# Patient Record
Sex: Male | Born: 1955 | Race: White | Hispanic: No | Smoking: Current every day smoker
Health system: Southern US, Community
[De-identification: ages and names within clinical notes are randomized; demographics above are authoritative.]

## PROBLEM LIST (undated history)

## (undated) DIAGNOSIS — Z87442 Personal history of urinary calculi: Secondary | ICD-10-CM

## (undated) DIAGNOSIS — I10 Essential (primary) hypertension: Secondary | ICD-10-CM

## (undated) DIAGNOSIS — F17200 Nicotine dependence, unspecified, uncomplicated: Secondary | ICD-10-CM

## (undated) DIAGNOSIS — I82409 Acute embolism and thrombosis of unspecified deep veins of unspecified lower extremity: Secondary | ICD-10-CM

## (undated) DIAGNOSIS — M1610 Unilateral primary osteoarthritis, unspecified hip: Secondary | ICD-10-CM

## (undated) HISTORY — DX: Essential (primary) hypertension: I10

## (undated) HISTORY — DX: Unilateral primary osteoarthritis, unspecified hip: M16.10

## (undated) HISTORY — DX: Nicotine dependence, unspecified, uncomplicated: F17.200

## (undated) HISTORY — PX: LITHOTRIPSY: SUR834

## (undated) HISTORY — DX: Acute embolism and thrombosis of unspecified deep veins of unspecified lower extremity: I82.409

---

## 2002-12-30 ENCOUNTER — Encounter: Payer: Self-pay | Admitting: Neurosurgery

## 2002-12-30 ENCOUNTER — Ambulatory Visit (HOSPITAL_COMMUNITY): Admission: RE | Admit: 2002-12-30 | Discharge: 2002-12-30 | Payer: Self-pay | Admitting: Neurosurgery

## 2004-03-04 ENCOUNTER — Other Ambulatory Visit (HOSPITAL_COMMUNITY): Admission: RE | Admit: 2004-03-04 | Discharge: 2004-06-02 | Payer: Self-pay | Admitting: Psychiatry

## 2018-05-05 ENCOUNTER — Telehealth: Payer: Self-pay

## 2018-05-05 NOTE — Telephone Encounter (Signed)
Copied from CRM [id number]. Topic: General - Inquiry >> May 05, 2018 11:41 AM Gaynelle Adu wrote: Reason for CRM: pt is calling to request if Dr.todd could personal give him a call back in regards to finding a provider for him. 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 please advise

## 2019-03-18 ENCOUNTER — Other Ambulatory Visit: Payer: Self-pay

## 2019-03-18 DIAGNOSIS — Z20822 Contact with and (suspected) exposure to covid-19: Secondary | ICD-10-CM

## 2019-03-20 LAB — NOVEL CORONAVIRUS, NAA: SARS-CoV-2, NAA: NOT DETECTED

## 2020-03-07 DIAGNOSIS — U071 COVID-19: Secondary | ICD-10-CM

## 2020-03-07 HISTORY — DX: COVID-19: U07.1

## 2020-03-08 ENCOUNTER — Other Ambulatory Visit (HOSPITAL_COMMUNITY): Payer: Self-pay | Admitting: Oncology

## 2020-03-08 NOTE — Progress Notes (Signed)
Called to Discuss with patient about Covid symptoms and the use of regeneron, a monoclonal antibody infusion for those with mild to moderate Covid symptoms and at a high risk of hospitalization.     Pt is qualified for this infusion at the [facility] center due to co-morbid conditions and/or a member of an at-risk group.     Spoke to patient.  Patient qualifies for Mab infusion.  He is on day 8 of his symptoms.  He would like to wait at this time.  He will call us if symptoms worsen.  Mignon Pine, AGNP-C [phone] (Infusion Center Hotline)

## 2020-05-21 ENCOUNTER — Telehealth: Payer: Self-pay | Admitting: *Deleted

## 2020-05-21 NOTE — Telephone Encounter (Signed)
   [facility] Pre-operative Risk Assessment    HEARTCARE STAFF: - Please ensure there is not already an duplicate clearance open for this procedure. - Under Visit Info/Reason for Call, type in Other and utilize the format Clearance MM/DD/YY or Clearance TBD. Do not use dashes or single digits. - If request is for dental extraction, please clarify the # of teeth to be extracted.  Request for surgical clearance:  1. What type of surgery is being performed?  RIGHT TOTAL HIP REPLACEMENT    2. When is this surgery scheduled?  TBD   3. What type of clearance is required (medical clearance vs. Pharmacy clearance to hold med vs. Both)?  MEDICAL  4. Are there any medications that need to be held prior to surgery and how long? N/A   5. Practice name and name of physician performing surgery?  [facility] / DR. CAFFREY   6. What is the office phone number?  [phone]   7.   What is the office fax number?  [phone] ATTN:  KELLY  8.   Anesthesia type (None, local, MAC, general) ?    Jeanann Lewandowsky 05/21/2020, 12:11 PM  _________________________________________________________________   (provider comments below)

## 2020-05-21 NOTE — Telephone Encounter (Signed)
Pt have appt with Dr Ronette Deter 11/19

## 2020-05-21 NOTE — Telephone Encounter (Signed)
   Primary Cardiologist: Scheduled to see Dr. Izora Ribas 06/01/20 to establish care  Chart reviewed as part of pre-operative protocol coverage. Patient is new to our practice and will require an office visit in order to better assess preoperative cardiovascular risk.  Pre-op covering staff: - Please contact requesting surgeon's office via preferred method (i.e, phone, fax) to inform them of need for appointment prior to surgery.  Beatriz Stallion, PA-C  05/21/2020, 2:57 PM

## 2020-06-01 ENCOUNTER — Other Ambulatory Visit: Payer: Self-pay

## 2020-06-01 ENCOUNTER — Encounter: Payer: Self-pay | Admitting: Internal Medicine

## 2020-06-01 ENCOUNTER — Telehealth: Payer: Self-pay

## 2020-06-01 ENCOUNTER — Ambulatory Visit (INDEPENDENT_AMBULATORY_CARE_PROVIDER_SITE_OTHER): Payer: BC Managed Care – PPO | Admitting: Internal Medicine

## 2020-06-01 VITALS — BP 112/60 | HR 75 | Ht 68.0 in | Wt 167.0 lb

## 2020-06-01 DIAGNOSIS — I10 Essential (primary) hypertension: Secondary | ICD-10-CM | POA: Diagnosis not present

## 2020-06-01 DIAGNOSIS — Z720 Tobacco use: Secondary | ICD-10-CM

## 2020-06-01 DIAGNOSIS — Z01810 Encounter for preprocedural cardiovascular examination: Secondary | ICD-10-CM | POA: Diagnosis not present

## 2020-06-01 DIAGNOSIS — E785 Hyperlipidemia, unspecified: Secondary | ICD-10-CM

## 2020-06-01 NOTE — Telephone Encounter (Signed)
ERROR

## 2020-06-01 NOTE — Telephone Encounter (Signed)
Dr. Debby Bud note from 06/01/20 faxed to [facility] for surgery clearance... I also LM on her voicemail.

## 2020-06-01 NOTE — Patient Instructions (Signed)
Medication Instructions:   *If you need a refill on your cardiac medications before your next appointment, please call your pharmacy*   Lab Work:  If you have labs (blood work) drawn today and your tests are completely normal, you will receive your results only by: Marland Kitchen MyChart Message (if you have MyChart) OR . A paper copy in the mail If you have any lab test that is abnormal or we need to change your treatment, we will call you to review the results.   Testing/Procedures:    Follow-Up: At [facility], you and your health needs are our priority.  As part of our continuing mission to provide you with exceptional heart care, we have created designated Provider Care Teams.  These Care Teams include your primary Cardiologist (physician) and Advanced Practice Providers (APPs -  Physician Assistants and Nurse Practitioners) who all work together to provide you with the care you need, when you need it.  We recommend signing up for the patient portal called "MyChart".  Sign up information is provided on this After Visit Summary.  MyChart is used to connect with patients for Virtual Visits (Telemedicine).  Patients are able to view lab/test results, encounter notes, upcoming appointments, etc.  Non-urgent messages can be sent to your provider as well.   To learn more about what you can do with MyChart, go to [email or web address].    Your next appointment:   4 month(s)  The format for your next appointment:   In Person  Provider:   Riley Lam, MD   Other Instructions

## 2020-06-01 NOTE — Progress Notes (Signed)
Cardiology Office Note:    Date:  06/01/2020   ID:  KYVON HU, DOB [birth year], MRN [id number]  PCP:  Eartha Inch, MD  [facility] Cardiologist:  No primary care provider on file.  [facility] Electrophysiologist:  None   CC: Preoperative R hip surgery Consulted for the evaluation of preoperative risk stratification at the behest of Eartha Inch, MD  History of Present Illness:    Anthony Small is a 64 y.o. male with a hx of HTN, HLD Tobacco abuse (dow to 1/2 pack a day), prior DVT/PE; evidence of prior CT imaging of prior microvascular ischemia without deficit, for surgical risk stratification for R hip surgery:  Dr. Delbert Harness, General Anesthesia.  Patient notes that he has had right hip that has progressed to painful walking.  As part of eval, is hoping to get R hip surgery.  Patient notes that he is much more sedentary that he used to be.  Able to do house chores. Never had chest pain, chest pressure, SOB, PND, Orthopnea, Syncope or near syncope.  His limitations for golf or heavy house work (and other significant activity) has been R hip pain. Does not easy bleeding.  Ambulatory Blood pressure note 120/80.  Past Medical History:  Diagnosis Date  . Arthritis of hip   . DVT (deep venous thrombosis) (HCC)   . Hypertension   . Smoker     History reviewed. No pertinent surgical history.  Current Medications: Current Meds  Medication Sig  . amLODipine (NORVASC) 5 MG tablet Take 5 mg by mouth daily.  Marland Kitchen Apixaban (ELIQUIS PO) Take by mouth.  Marland Kitchen HYDROcodone-acetaminophen (NORCO/VICODIN) 5-325 MG tablet Take 1 tablet by mouth every 4 (four) hours as needed.  Marland Kitchen lisinopril (ZESTRIL) 10 MG tablet Take 10 mg by mouth daily.  . rosuvastatin (CRESTOR) 10 MG tablet Take 10 mg by mouth at bedtime.     Allergies:   Bee venom   Social History   Socioeconomic History  . Marital status: Married    Spouse name: Not on file  . Number of children: Not on file    . Years of education: Not on file  . Highest education level: Not on file  Occupational History  . Not on file  Tobacco Use  . Smoking status: Current Every Day Smoker  . Smokeless tobacco: Never Used  Substance and Sexual Activity  . Alcohol use: Not on file  . Drug use: Not on file  . Sexual activity: Not on file  Other Topics Concern  . Not on file  Social History Narrative  . Not on file   Social Determinants of Health   Financial Resource Strain:   . Difficulty of Paying Living Expenses: Not on file  Food Insecurity:   . Worried About Programme researcher, broadcasting/film/video in the Last Year: Not on file  . Ran Out of Food in the Last Year: Not on file  Transportation Needs:   . Lack of Transportation (Medical): Not on file  . Lack of Transportation (Non-Medical): Not on file  Physical Activity:   . Days of Exercise per Week: Not on file  . Minutes of Exercise per Session: Not on file  Stress:   . Feeling of Stress : Not on file  Social Connections:   . Frequency of Communication with Friends and Family: Not on file  . Frequency of Social Gatherings with Friends and Family: Not on file  . Attends Religious Services: Not on file  .  Active Member of Clubs or Organizations: Not on file  . Attends Banker Meetings: Not on file  . Marital Status: Not on file    Former Respiratory Therapist  Family History: The patient's family history includes Cancer in his father; Hypertension in his mother. Sister had AV Malformation  ROS:   Please see the history of present illness.    All other systems reviewed and are negative.  EKGs/Labs/Other Studies Reviewed:    The following studies were reviewed today:  EKG:  EKG is ordered today.  The ekg ordered today demonstrates SR rate 75, nonspecific TWI  Recent Labs: No results found for requested labs within last 8760 hours.  Recent Lipid Panel No results found for: CHOL, TRIG, HDL, CHOLHDL, VLDL, LDLCALC, LDLDIRECT  Physical  Exam:    VS:  BP 112/60   Pulse 75   Ht 5\' 8"  (1.727 m)   Wt 167 lb (75.8 kg)   SpO2 98%   BMI 25.39 kg/m     Wt Readings from Last 3 Encounters:  06/01/20 167 lb (75.8 kg)    GEN: Well nourished, well developed in no acute distress HEENT: Normal NECK: No JVD; No carotid bruits LYMPHATICS: No lymphadenopathy CARDIAC: RRR, no murmurs, rubs, gallops RESPIRATORY:  Clear to auscultation without rales, wheezing or rhonchi  ABDOMEN: Soft, non-tender, non-distended MUSCULOSKELETAL:  No edema; No deformity  SKIN: Warm and dry NEUROLOGIC:  Alert and oriented x 3 PSYCHIATRIC:  Normal affect   ASSESSMENT:    1. Preoperative cardiovascular examination   2. Essential hypertension   3. Hyperlipidemia, unspecified hyperlipidemia type   4. Tobacco abuse    PLAN:    In order of problems listed above:  Preoperative Risk Assessment - The Revised Cardiac Risk Index = 1 (with borderline CVA) =0.9% estimated risk of perioperative myocardial infarction, pulmonary edema, ventricular fibrillation, cardiac arrest, or complete heart block.  - DASI of 29, consistent with 6 Functional Mets - No further cardiac testing is recommended prior to surgery.  - The patient may proceed to surgery at acceptable risk.   - Our service is available as needed in the peri-operative period.    Essential Hypertension HLD - ambulatory blood pressure 120/80, will continue ambulatory BP monitoring (gave education) - LDL well controlled - would hold the lipid   DVT - continue  Tobacco Abuse - discussed the dangers of tobacco use, both inhaled and oral, which include, but are not limited to cardiovascular disease, increased cancer risk of multiple types of cancer, COPD, peripheral arterial disease, strokes. - counseled on the benefits of smoking cessation. - firmly advised to quit.  - we also reviewed strategies to maximize success, including:  Removing cigarettes and smoking materials from environment    Stress management  Substitution of other forms of reinforcement (the one cigarette a day approach)  Support of family/friends and group smoking cessation  Selecting a quit date.  Patient provided contact information for [facility] or 1-800-QUIT-NOW  Patient provided with [employer] free smoking cessation classes: [phone] and [phone]  - discussed smoking cessation in the perioperative period - at next visit will trial Chantix again  4-5 months follow up unless new symptoms or abnormal test results warranting change in plan  Would be reasonable for Virtual Follow up Would be reasonable for  APP Follow up   Shared Decision Making/Informed Consent       Medication Adjustments/Labs and Tests Ordered: Current medicines are reviewed at length with the patient today.  Concerns  regarding medicines are outlined above.  Orders Placed This Encounter  Procedures  . EKG 12-Lead   No orders of the defined types were placed in this encounter.   Patient Instructions  Medication Instructions:   *If you need a refill on your cardiac medications before your next appointment, please call your pharmacy*   Lab Work:  If you have labs (blood work) drawn today and your tests are completely normal, you will receive your results only by: Marland Kitchen MyChart Message (if you have MyChart) OR . A paper copy in the mail If you have any lab test that is abnormal or we need to change your treatment, we will call you to review the results.   Testing/Procedures:    Follow-Up: At [facility], you and your health needs are our priority.  As part of our continuing mission to provide you with exceptional heart care, we have created designated Provider Care Teams.  These Care Teams include your primary Cardiologist (physician) and Advanced Practice Providers (APPs -  Physician Assistants and Nurse Practitioners) who all work together to provide you with the care you need,  when you need it.  We recommend signing up for the patient portal called "MyChart".  Sign up information is provided on this After Visit Summary.  MyChart is used to connect with patients for Virtual Visits (Telemedicine).  Patients are able to view lab/test results, encounter notes, upcoming appointments, etc.  Non-urgent messages can be sent to your provider as well.   To learn more about what you can do with MyChart, go to [email or web address].    Your next appointment:   4 month(s)  The format for your next appointment:   In Person  Provider:   Riley Lam, MD   Other Instructions      Signed, Christell Constant, MD  06/01/2020 9:20 AM    [facility]

## 2020-06-18 ENCOUNTER — Ambulatory Visit: Payer: Self-pay | Admitting: Physician Assistant

## 2020-06-18 NOTE — H&P (Signed)
TOTAL HIP ADMISSION H&P  Patient is admitted for right total hip arthroplasty.  Subjective:  Chief Complaint: right hip pain  HPI: Anthony Small, 64 y.o. male, has a history of pain and functional disability in the right hip(s) due to arthritis and patient has failed non-surgical conservative treatments for greater than 12 weeks to include NSAID's and/or analgesics and activity modification.  Onset of symptoms was gradual starting 7 years ago with gradually worsening course since that time.The patient noted no past surgery on the right hip(s).  Patient currently rates pain in the right hip at 9 out of 10 with activity. Patient has night pain, worsening of pain with activity and weight bearing, pain that interfers with activities of daily living and pain with passive range of motion. Patient has evidence of subchondral cysts, periarticular osteophytes and joint space narrowing by imaging studies. This condition presents safety issues increasing the risk of falls. There is no current active infection.  Patient Active Problem List   Diagnosis Date Noted  . Preoperative cardiovascular examination 06/01/2020  . Essential hypertension 06/01/2020  . Hyperlipidemia 06/01/2020  . Tobacco abuse 06/01/2020   Past Medical History:  Diagnosis Date  . Arthritis of hip   . DVT (deep venous thrombosis) (HCC)   . Hypertension   . Smoker     No past surgical history on file.  Current Outpatient Medications  Medication Sig Dispense Refill Last Dose  . amLODipine (NORVASC) 5 MG tablet Take 5 mg by mouth daily.     . apixaban (ELIQUIS) 5 MG TABS tablet Take 5 mg by mouth 2 (two) times daily.      . HYDROcodone-acetaminophen (NORCO/VICODIN) 5-325 MG tablet Take 1 tablet by mouth every 6 (six) hours as needed for moderate pain.      . lisinopril (ZESTRIL) 10 MG tablet Take 10 mg by mouth daily.     . rosuvastatin (CRESTOR) 10 MG tablet Take 10 mg by mouth at bedtime.      No current  facility-administered medications for this visit.   Allergies  Allergen Reactions  . Bee Venom Hives    Social History   Tobacco Use  . Smoking status: Current Every Day Smoker  . Smokeless tobacco: Never Used  Substance Use Topics  . Alcohol use: Not on file    Family History  Problem Relation Age of Onset  . Hypertension Mother   . Cancer Father      Review of Systems  HENT: Positive for tinnitus.   Cardiovascular: Positive for chest pain.  Gastrointestinal: Positive for constipation, diarrhea, nausea and vomiting.  Musculoskeletal: Positive for arthralgias.  Neurological: Positive for headaches.  Hematological: Bruises/bleeds easily.  All other systems reviewed and are negative.   Objective:  Physical Exam Constitutional:      General: He is not in acute distress.    Appearance: Normal appearance.  HENT:     Head: Normocephalic and atraumatic.  Eyes:     Extraocular Movements: Extraocular movements intact.     Pupils: Pupils are equal, round, and reactive to light.  Cardiovascular:     Rate and Rhythm: Regular rhythm. Tachycardia present.     Pulses: Normal pulses.     Heart sounds: Normal heart sounds.  Pulmonary:     Effort: Pulmonary effort is normal. No respiratory distress.     Breath sounds: Normal breath sounds.  Abdominal:     General: Abdomen is flat. Bowel sounds are normal. There is no distension.     Palpations: Abdomen   is soft.     Tenderness: There is no abdominal tenderness.  Musculoskeletal:     Cervical back: Normal range of motion and neck supple.     Right hip: Tenderness and bony tenderness present. Decreased range of motion. Decreased strength.  Lymphadenopathy:     Cervical: No cervical adenopathy.  Skin:    General: Skin is warm and dry.     Findings: No erythema or rash.  Neurological:     General: No focal deficit present.     Mental Status: He is alert and oriented to person, place, and time.  Psychiatric:        Mood and  Affect: Mood normal.        Behavior: Behavior normal.     Vital signs in last 24 hours: @VSRANGES @  Labs:   Estimated body mass index is 25.39 kg/m as calculated from the following:   Height as of 06/01/20: 5\' 8"  (1.727 m).   Weight as of 06/01/20: 75.8 kg.   Imaging Review Plain radiographs demonstrate severe degenerative joint disease of the right hip(s). The bone quality appears to be good for age and reported activity level.      Assessment/Plan:  End stage arthritis, right hip(s)  The patient history, physical examination, clinical judgement of the provider and imaging studies are consistent with end stage degenerative joint disease of the right hip(s) and total hip arthroplasty is deemed medically necessary. The treatment options including medical management, injection therapy, arthroscopy and arthroplasty were discussed at length. The risks and benefits of total hip arthroplasty were presented and reviewed. The risks due to aseptic loosening, infection, stiffness, dislocation/subluxation,  thromboembolic complications and other imponderables were discussed.  The patient acknowledged the explanation, agreed to proceed with the plan and consent was signed. Patient is being admitted for inpatient treatment for surgery, pain control, PT, OT, prophylactic antibiotics, VTE prophylaxis, progressive ambulation and ADL's and discharge planning.The patient is planning to be discharged home with outpt PT   Anticipated LOS equal to or greater than 2 midnights due to - Age 65 and older with one or more of the following:  - Obesity  - Expected need for hospital services (PT, OT, Nursing) required for safe  discharge  - Anticipated need for postoperative skilled nursing care or inpatient rehab  - Active co-morbidities: Stroke and DVT/VTE OR   - Unanticipated findings during/Post Surgery: None  - Patient is a high risk of re-admission due to: None

## 2020-06-18 NOTE — H&P (View-Only) (Signed)
TOTAL HIP ADMISSION H&P  Patient is admitted for right total hip arthroplasty.  Subjective:  Chief Complaint: right hip pain  HPI: Anthony Small, 64 y.o. male, has a history of pain and functional disability in the right hip(s) due to arthritis and patient has failed non-surgical conservative treatments for greater than 12 weeks to include NSAID's and/or analgesics and activity modification.  Onset of symptoms was gradual starting 7 years ago with gradually worsening course since that time.The patient noted no past surgery on the right hip(s).  Patient currently rates pain in the right hip at 9 out of 10 with activity. Patient has night pain, worsening of pain with activity and weight bearing, pain that interfers with activities of daily living and pain with passive range of motion. Patient has evidence of subchondral cysts, periarticular osteophytes and joint space narrowing by imaging studies. This condition presents safety issues increasing the risk of falls. There is no current active infection.  Patient Active Problem List   Diagnosis Date Noted  . Preoperative cardiovascular examination 06/01/2020  . Essential hypertension 06/01/2020  . Hyperlipidemia 06/01/2020  . Tobacco abuse 06/01/2020   Past Medical History:  Diagnosis Date  . Arthritis of hip   . DVT (deep venous thrombosis) (HCC)   . Hypertension   . Smoker     No past surgical history on file.  Current Outpatient Medications  Medication Sig Dispense Refill Last Dose  . amLODipine (NORVASC) 5 MG tablet Take 5 mg by mouth daily.     Marland Kitchen apixaban (ELIQUIS) 5 MG TABS tablet Take 5 mg by mouth 2 (two) times daily.      Marland Kitchen HYDROcodone-acetaminophen (NORCO/VICODIN) 5-325 MG tablet Take 1 tablet by mouth every 6 (six) hours as needed for moderate pain.      Marland Kitchen lisinopril (ZESTRIL) 10 MG tablet Take 10 mg by mouth daily.     . rosuvastatin (CRESTOR) 10 MG tablet Take 10 mg by mouth at bedtime.      No current  facility-administered medications for this visit.   Allergies  Allergen Reactions  . Bee Venom Hives    Social History   Tobacco Use  . Smoking status: Current Every Day Smoker  . Smokeless tobacco: Never Used  Substance Use Topics  . Alcohol use: Not on file    Family History  Problem Relation Age of Onset  . Hypertension Mother   . Cancer Father      Review of Systems  HENT: Positive for tinnitus.   Cardiovascular: Positive for chest pain.  Gastrointestinal: Positive for constipation, diarrhea, nausea and vomiting.  Musculoskeletal: Positive for arthralgias.  Neurological: Positive for headaches.  Hematological: Bruises/bleeds easily.  All other systems reviewed and are negative.   Objective:  Physical Exam Constitutional:      General: He is not in acute distress.    Appearance: Normal appearance.  HENT:     Head: Normocephalic and atraumatic.  Eyes:     Extraocular Movements: Extraocular movements intact.     Pupils: Pupils are equal, round, and reactive to light.  Cardiovascular:     Rate and Rhythm: Regular rhythm. Tachycardia present.     Pulses: Normal pulses.     Heart sounds: Normal heart sounds.  Pulmonary:     Effort: Pulmonary effort is normal. No respiratory distress.     Breath sounds: Normal breath sounds.  Abdominal:     General: Abdomen is flat. Bowel sounds are normal. There is no distension.     Palpations: Abdomen  is soft.     Tenderness: There is no abdominal tenderness.  Musculoskeletal:     Cervical back: Normal range of motion and neck supple.     Right hip: Tenderness and bony tenderness present. Decreased range of motion. Decreased strength.  Lymphadenopathy:     Cervical: No cervical adenopathy.  Skin:    General: Skin is warm and dry.     Findings: No erythema or rash.  Neurological:     General: No focal deficit present.     Mental Status: He is alert and oriented to person, place, and time.  Psychiatric:        Mood and  Affect: Mood normal.        Behavior: Behavior normal.     Vital signs in last 24 hours: @VSRANGES @  Labs:   Estimated body mass index is 25.39 kg/m as calculated from the following:   Height as of 06/01/20: 5\' 8"  (1.727 m).   Weight as of 06/01/20: 75.8 kg.   Imaging Review Plain radiographs demonstrate severe degenerative joint disease of the right hip(s). The bone quality appears to be good for age and reported activity level.      Assessment/Plan:  End stage arthritis, right hip(s)  The patient history, physical examination, clinical judgement of the provider and imaging studies are consistent with end stage degenerative joint disease of the right hip(s) and total hip arthroplasty is deemed medically necessary. The treatment options including medical management, injection therapy, arthroscopy and arthroplasty were discussed at length. The risks and benefits of total hip arthroplasty were presented and reviewed. The risks due to aseptic loosening, infection, stiffness, dislocation/subluxation,  thromboembolic complications and other imponderables were discussed.  The patient acknowledged the explanation, agreed to proceed with the plan and consent was signed. Patient is being admitted for inpatient treatment for surgery, pain control, PT, OT, prophylactic antibiotics, VTE prophylaxis, progressive ambulation and ADL's and discharge planning.The patient is planning to be discharged home with outpt PT   Anticipated LOS equal to or greater than 2 midnights due to - Age 65 and older with one or more of the following:  - Obesity  - Expected need for hospital services (PT, OT, Nursing) required for safe  discharge  - Anticipated need for postoperative skilled nursing care or inpatient rehab  - Active co-morbidities: Stroke and DVT/VTE OR   - Unanticipated findings during/Post Surgery: None  - Patient is a high risk of re-admission due to: None

## 2020-06-19 ENCOUNTER — Encounter (HOSPITAL_COMMUNITY): Payer: Self-pay

## 2020-06-19 NOTE — Progress Notes (Addendum)
COVID Vaccine Completed:   x3 Date COVID Vaccine completed:  Booster 11-21 COVID vaccine manufacturer: [employer]   PCP - Antony Haste, MD Cardiologist - Dr. Izora Ribas  Cardiac clearance in Epic dated 06-01-20 by Dr. Izora Ribas.  Medical clearance on chart dated 05-21-20 from Dr. Cyndia Bent  Chest x-ray -  EKG - 06-01-20 in Epic Stress Test -  ECHO -  Cardiac Cath -  Pacemaker/ICD device last checked:  Sleep Study -  CPAP -   Fasting Blood Sugar -  Checks Blood Sugar _____ times a day  Blood Thinner Instructions:  Eliquis 5 mg for DVT.  To stop 2 days prior to surgery per medical clearance dated 05-21-20. Aspirin Instructions: Last Dose:  Anesthesia review:  Pt follows with cardiology for hx of DVT.    Patient denies shortness of breath, fever, cough and chest pain at PAT appointment.  Patient able to slowly walk a flight of stairs due to hip pain.  Able to perform ADLs independently.   Patient verbalized understanding of instructions that were given to them at the PAT appointment. Patient was also instructed that they will need to review over the PAT instructions again at home before surgery.

## 2020-06-19 NOTE — Patient Instructions (Addendum)
DUE TO COVID-19 ONLY ONE VISITOR IS ALLOWED TO COME WITH YOU AND STAY IN THE WAITING ROOM ONLY DURING PRE OP AND PROCEDURE.   IF YOU WILL BE ADMITTED INTO THE HOSPITAL YOU ARE ALLOWED ONE SUPPORT PERSON DURING VISITATION HOURS ONLY (10AM -8PM)   . The support person may change daily. . The support person must pass our screening, gel in and out, and wear a mask at all times, including in the patient's room. . Patients must also wear a mask when staff or their support person are in the room.   COVID SWAB TESTING MUST BE COMPLETED ON:  Saturday, 06-23-20 @ 9:20 AM   [address] [place], [place] [address]  (Must self quarantine after testing. Follow instructions on handout.)    Your procedure is scheduled on:  Wednesday, 06-27-20   Report to [facility] Main  Entrance   Report to admitting at 9:15 AM   Call this number if you have problems the morning of surgery [phone]   Do not eat food :After Midnight.   May have liquids until 8:45 AM day of surgery  CLEAR LIQUID DIET  Foods Allowed                                                                     Foods Excluded  Water, Black Coffee and tea, regular and decaf           liquids that you cannot  Plain Jell-O in any flavor  (No red)                                  see through such as: Fruit ices (not with fruit pulp)                                      milk, soups, orange juice              Iced Popsicles (No red)                                      All solid food                                   Apple juices Sports drinks like Gatorade (No red) Lightly seasoned clear broth or consume(fat free) Sugar, honey syrup     Complete one Ensure drink the morning of surgery at 8:45 AM  the day of surgery.     Oral Hygiene is also important to reduce your risk of infection.                                    Remember - BRUSH YOUR TEETH THE MORNING OF SURGERY WITH YOUR REGULAR TOOTHPASTE   Do NOT smoke after  Midnight   Take these medicines the morning of surgery with A SIP OF WATER:  Amlodipine, Crestor                                You may not have any metal on your body including  jewelry, and body piercings             Do not wear lotions, powders, perfumes/cologne, or deodorant             Men may shave face and neck.   Do not bring valuables to the hospital. [facility] IS NOT RESPONSIBLE   FOR VALUABLES.   Contacts, dentures or bridgework may not be worn into surgery.   Bring small overnight bag day of surgery.     Special Instructions: Bring a copy of your healthcare power of attorney and living will documents the day of surgery if you haven't scanned them in before.              Please read over the following fact sheets you were given: IF YOU HAVE QUESTIONS ABOUT YOUR PRE OP INSTRUCTIONS PLEASE CALL [phone]   [facility] - Preparing for Surgery Before surgery, you can play an important role.  Because skin is not sterile, your skin needs to be as free of germs as possible.  You can reduce the number of germs on your skin by washing with CHG (chlorahexidine gluconate) soap before surgery.  CHG is an antiseptic cleaner which kills germs and bonds with the skin to continue killing germs even after washing. Please DO NOT use if you have an allergy to CHG or antibacterial soaps.  If your skin becomes reddened/irritated stop using the CHG and inform your nurse when you arrive at Short Stay. Do not shave (including legs and underarms) for at least 48 hours prior to the first CHG shower.  You may shave your face/neck.  Please follow these instructions carefully:  1.  Shower with CHG Soap the night before surgery and the  morning of surgery.  2.  If you choose to wash your hair, wash your hair first as usual with your normal  shampoo.  3.  After you shampoo, rinse your hair and body thoroughly to remove the shampoo.                             4.  Use CHG as you would any other liquid  soap.  You can apply chg directly to the skin and wash.  Gently with a scrungie or clean washcloth.  5.  Apply the CHG Soap to your body ONLY FROM THE NECK DOWN.   Do   not use on face/ open                           Wound or open sores. Avoid contact with eyes, ears mouth and   genitals (private parts).                       Wash face,  Genitals (private parts) with your normal soap.             6.  Wash thoroughly, paying special attention to the area where your    surgery  will be performed.  7.  Thoroughly rinse your body with warm water from the neck down.  8.  DO NOT shower/wash with your normal soap after using and rinsing  off the CHG Soap.                9.  Pat yourself dry with a clean towel.            10.  Wear clean pajamas.            11.  Place clean sheets on your bed the night of your first shower and do not  sleep with pets. Day of Surgery : Do not apply any lotions/deodorants the morning of surgery.  Please wear clean clothes to the hospital/surgery center.  FAILURE TO FOLLOW THESE INSTRUCTIONS MAY RESULT IN THE CANCELLATION OF YOUR SURGERY  PATIENT SIGNATURE_________________________________  NURSE SIGNATURE__________________________________  ________________________________________________________________________   Rogelia MireIncentive Spirometer  An incentive spirometer is a tool that can help keep your lungs clear and active. This tool measures how well you are filling your lungs with each breath. Taking long deep breaths may help reverse or decrease the chance of developing breathing (pulmonary) problems (especially infection) following:  A long period of time when you are unable to move or be active. BEFORE THE PROCEDURE   If the spirometer includes an indicator to show your best effort, your nurse or respiratory therapist will set it to a desired goal.  If possible, sit up straight or lean slightly forward. Try not to slouch.  Hold the incentive spirometer in an upright  position. INSTRUCTIONS FOR USE  1. Sit on the edge of your bed if possible, or sit up as far as you can in bed or on a chair. 2. Hold the incentive spirometer in an upright position. 3. Breathe out normally. 4. Place the mouthpiece in your mouth and seal your lips tightly around it. 5. Breathe in slowly and as deeply as possible, raising the piston or the ball toward the top of the column. 6. Hold your breath for 3-5 seconds or for as long as possible. Allow the piston or ball to fall to the bottom of the column. 7. Remove the mouthpiece from your mouth and breathe out normally. 8. Rest for a few seconds and repeat Steps 1 through 7 at least 10 times every 1-2 hours when you are awake. Take your time and take a few normal breaths between deep breaths. 9. The spirometer may include an indicator to show your best effort. Use the indicator as a goal to work toward during each repetition. 10. After each set of 10 deep breaths, practice coughing to be sure your lungs are clear. If you have an incision (the cut made at the time of surgery), support your incision when coughing by placing a pillow or rolled up towels firmly against it. Once you are able to get out of bed, walk around indoors and cough well. You may stop using the incentive spirometer when instructed by your caregiver.  RISKS AND COMPLICATIONS  Take your time so you do not get dizzy or light-headed.  If you are in pain, you may need to take or ask for pain medication before doing incentive spirometry. It is harder to take a deep breath if you are having pain. AFTER USE  Rest and breathe slowly and easily.  It can be helpful to keep track of a log of your progress. Your caregiver can provide you with a simple table to help with this. If you are using the spirometer at home, follow these instructions: SEEK MEDICAL CARE IF:   You are having difficultly using the spirometer.  You have trouble using the spirometer  as often as  instructed.  Your pain medication is not giving enough relief while using the spirometer.  You develop fever of 100.5 F (38.1 C) or higher. SEEK IMMEDIATE MEDICAL CARE IF:   You cough up bloody sputum that had not been present before.  You develop fever of 102 F (38.9 C) or greater.  You develop worsening pain at or near the incision site. MAKE SURE YOU:   Understand these instructions.  Will watch your condition.  Will get help right away if you are not doing well or get worse. Document Released: 11/10/2006 Document Revised: 09/22/2011 Document Reviewed: 01/11/2007 Exit[employer] Patient Information 2014 [employer], [place].   ________________________________________________________________________  WHAT IS A BLOOD TRANSFUSION? Blood Transfusion Information  A transfusion is the replacement of blood or some of its parts. Blood is made up of multiple cells which provide different functions.  Red blood cells carry oxygen and are used for blood loss replacement.  White blood cells fight against infection.  Platelets control bleeding.  Plasma helps clot blood.  Other blood products are available for specialized needs, such as hemophilia or other clotting disorders. BEFORE THE TRANSFUSION  Who gives blood for transfusions?   Healthy volunteers who are fully evaluated to make sure their blood is safe. This is blood bank blood. Transfusion therapy is the safest it has ever been in the practice of medicine. Before blood is taken from a donor, a complete history is taken to make sure that person has no history of diseases nor engages in risky social behavior (examples are intravenous drug use or sexual activity with multiple partners). The donor's travel history is screened to minimize risk of transmitting infections, such as malaria. The donated blood is tested for signs of infectious diseases, such as HIV and hepatitis. The blood is then tested to be sure it is compatible with you in  order to minimize the chance of a transfusion reaction. If you or a relative donates blood, this is often done in anticipation of surgery and is not appropriate for emergency situations. It takes many days to process the donated blood. RISKS AND COMPLICATIONS Although transfusion therapy is very safe and saves many lives, the main dangers of transfusion include:   Getting an infectious disease.  Developing a transfusion reaction. This is an allergic reaction to something in the blood you were given. Every precaution is taken to prevent this. The decision to have a blood transfusion has been considered carefully by your caregiver before blood is given. Blood is not given unless the benefits outweigh the risks. AFTER THE TRANSFUSION  Right after receiving a blood transfusion, you will usually feel much better and more energetic. This is especially true if your red blood cells have gotten low (anemic). The transfusion raises the level of the red blood cells which carry oxygen, and this usually causes an energy increase.  The nurse administering the transfusion will monitor you carefully for complications. HOME CARE INSTRUCTIONS  No special instructions are needed after a transfusion. You may find your energy is better. Speak with your caregiver about any limitations on activity for underlying diseases you may have. SEEK MEDICAL CARE IF:   Your condition is not improving after your transfusion.  You develop redness or irritation at the intravenous (IV) site. SEEK IMMEDIATE MEDICAL CARE IF:  Any of the following symptoms occur over the next 12 hours:  Shaking chills.  You have a temperature by mouth above 102 F (38.9 C), not controlled by medicine.  Chest, back,  or muscle pain.  People around you feel you are not acting correctly or are confused.  Shortness of breath or difficulty breathing.  Dizziness and fainting.  You get a rash or develop hives.  You have a decrease in urine  output.  Your urine turns a dark color or changes to pink, red, or brown. Any of the following symptoms occur over the next 10 days:  You have a temperature by mouth above 102 F (38.9 C), not controlled by medicine.  Shortness of breath.  Weakness after normal activity.  The white part of the eye turns yellow (jaundice).  You have a decrease in the amount of urine or are urinating less often.  Your urine turns a dark color or changes to pink, red, or brown. Document Released: 06/27/2000 Document Revised: 09/22/2011 Document Reviewed: 02/14/2008 [facility] Patient Information 2014 [place], [place].  _______________________________________________________________________

## 2020-06-20 ENCOUNTER — Encounter (HOSPITAL_COMMUNITY)
Admission: RE | Admit: 2020-06-20 | Discharge: 2020-06-20 | Disposition: A | Discharge status: Home / Self Care | Payer: BC Managed Care – PPO | Source: Ambulatory Visit | Attending: Orthopedic Surgery | Admitting: Orthopedic Surgery

## 2020-06-20 ENCOUNTER — Encounter (HOSPITAL_COMMUNITY): Payer: Self-pay

## 2020-06-20 ENCOUNTER — Other Ambulatory Visit: Payer: Self-pay

## 2020-06-20 DIAGNOSIS — Z01812 Encounter for preprocedural laboratory examination: Secondary | ICD-10-CM | POA: Insufficient documentation

## 2020-06-20 HISTORY — DX: Personal history of urinary calculi: Z87.442

## 2020-06-20 LAB — CBC WITH DIFFERENTIAL/PLATELET
Abs Immature Granulocytes: 0.03 10*3/uL (ref 0.00–0.07)
Basophils Absolute: 0.1 10*3/uL (ref 0.0–0.1)
Basophils Relative: 0 %
Eosinophils Absolute: 0.1 10*3/uL (ref 0.0–0.5)
Eosinophils Relative: 0 %
HCT: 47.4 % (ref 39.0–52.0)
Hemoglobin: 16.1 g/dL (ref 13.0–17.0)
Immature Granulocytes: 0 %
Lymphocytes Relative: 23 %
Lymphs Abs: 2.6 10*3/uL (ref 0.7–4.0)
MCH: 33.3 pg (ref 26.0–34.0)
MCHC: 34 g/dL (ref 30.0–36.0)
MCV: 97.9 fL (ref 80.0–100.0)
Monocytes Absolute: 0.7 10*3/uL (ref 0.1–1.0)
Monocytes Relative: 6 %
Neutro Abs: 7.8 10*3/uL — ABNORMAL HIGH (ref 1.7–7.7)
Neutrophils Relative %: 71 %
Platelets: 266 10*3/uL (ref 150–400)
RBC: 4.84 MIL/uL (ref 4.22–5.81)
RDW: 13.9 % (ref 11.5–15.5)
WBC: 11.3 10*3/uL — ABNORMAL HIGH (ref 4.0–10.5)
nRBC: 0 % (ref 0.0–0.2)

## 2020-06-20 LAB — COMPREHENSIVE METABOLIC PANEL
ALT: 18 U/L (ref 0–44)
AST: 21 U/L (ref 15–41)
Albumin: 4.2 g/dL (ref 3.5–5.0)
Alkaline Phosphatase: 85 U/L (ref 38–126)
Anion gap: 13 (ref 5–15)
BUN: 17 mg/dL (ref 8–23)
CO2: 22 mmol/L (ref 22–32)
Calcium: 9.3 mg/dL (ref 8.9–10.3)
Chloride: 106 mmol/L (ref 98–111)
Creatinine, Ser: 0.99 mg/dL (ref 0.61–1.24)
GFR, Estimated: >60 mL/min (ref >60)
Glucose, Bld: 98 mg/dL (ref 70–99)
Potassium: 4.5 mmol/L (ref 3.5–5.1)
Sodium: 141 mmol/L (ref 135–145)
Total Bilirubin: 1 mg/dL (ref 0.3–1.2)
Total Protein: 7.8 g/dL (ref 6.5–8.1)

## 2020-06-20 LAB — SURGICAL PCR SCREEN
MRSA, PCR: NEGATIVE
Staphylococcus aureus: POSITIVE — AB

## 2020-06-20 LAB — PROTIME-INR
INR: 1 (ref 0.8–1.2)
Prothrombin Time: 13.2 seconds (ref 11.4–15.2)

## 2020-06-20 LAB — APTT: aPTT: 29 seconds (ref 24–36)

## 2020-06-20 NOTE — Progress Notes (Signed)
PCR results sent to Dr. Madelon Lips to review.

## 2020-06-23 ENCOUNTER — Other Ambulatory Visit (HOSPITAL_COMMUNITY)
Admission: RE | Admit: 2020-06-23 | Discharge: 2020-06-23 | Disposition: A | Discharge status: Home / Self Care | Payer: BC Managed Care – PPO | Source: Ambulatory Visit | Attending: Orthopedic Surgery | Admitting: Orthopedic Surgery

## 2020-06-23 DIAGNOSIS — Z01812 Encounter for preprocedural laboratory examination: Secondary | ICD-10-CM | POA: Insufficient documentation

## 2020-06-23 DIAGNOSIS — Z20822 Contact with and (suspected) exposure to covid-19: Secondary | ICD-10-CM | POA: Diagnosis not present

## 2020-06-24 LAB — SARS CORONAVIRUS 2 (TAT 6-24 HRS): SARS Coronavirus 2: NEGATIVE

## 2020-06-26 MED ORDER — BUPIVACAINE LIPOSOME 1.3 % IJ SUSP
10.0000 mL | Freq: Once | INTRAMUSCULAR | Status: DC
  Filled 2020-06-26: qty 10

## 2020-06-26 MED ORDER — TRANEXAMIC ACID 1000 MG/10ML IV SOLN
2000.0000 mg | INTRAVENOUS | Status: DC
  Filled 2020-06-26: qty 20

## 2020-06-26 NOTE — Progress Notes (Signed)
Called patient about time change for surgery on 06/27/20. To arrive 0800 for surgery. Complete ensure drink by 0730.

## 2020-06-27 ENCOUNTER — Ambulatory Visit (HOSPITAL_COMMUNITY)
Admission: RE | Admit: 2020-06-27 | Discharge: 2020-06-28 | Disposition: A | Discharge status: Home / Self Care | Payer: BC Managed Care – PPO | Source: Ambulatory Visit | Attending: Orthopedic Surgery | Admitting: Orthopedic Surgery

## 2020-06-27 ENCOUNTER — Ambulatory Visit: Payer: Self-pay | Admitting: Physician Assistant

## 2020-06-27 ENCOUNTER — Encounter (HOSPITAL_COMMUNITY)
Admission: RE | Disposition: A | Discharge status: Home / Self Care | Payer: Self-pay | Source: Ambulatory Visit | Attending: Orthopedic Surgery

## 2020-06-27 ENCOUNTER — Ambulatory Visit (HOSPITAL_COMMUNITY): Payer: BC Managed Care – PPO | Admitting: Certified Registered Nurse Anesthetist

## 2020-06-27 ENCOUNTER — Other Ambulatory Visit: Payer: Self-pay

## 2020-06-27 ENCOUNTER — Encounter (HOSPITAL_COMMUNITY): Payer: Self-pay | Admitting: Orthopedic Surgery

## 2020-06-27 ENCOUNTER — Ambulatory Visit (HOSPITAL_COMMUNITY): Payer: BC Managed Care – PPO

## 2020-06-27 ENCOUNTER — Ambulatory Visit (HOSPITAL_COMMUNITY): Payer: BC Managed Care – PPO | Admitting: Physician Assistant

## 2020-06-27 DIAGNOSIS — Z7901 Long term (current) use of anticoagulants: Secondary | ICD-10-CM | POA: Insufficient documentation

## 2020-06-27 DIAGNOSIS — Z79899 Other long term (current) drug therapy: Secondary | ICD-10-CM | POA: Diagnosis not present

## 2020-06-27 DIAGNOSIS — M1611 Unilateral primary osteoarthritis, right hip: Secondary | ICD-10-CM | POA: Diagnosis present

## 2020-06-27 DIAGNOSIS — Z86718 Personal history of other venous thrombosis and embolism: Secondary | ICD-10-CM | POA: Diagnosis not present

## 2020-06-27 DIAGNOSIS — F17200 Nicotine dependence, unspecified, uncomplicated: Secondary | ICD-10-CM | POA: Insufficient documentation

## 2020-06-27 DIAGNOSIS — Z96641 Presence of right artificial hip joint: Secondary | ICD-10-CM

## 2020-06-27 HISTORY — PX: TOTAL HIP ARTHROPLASTY: SHX124

## 2020-06-27 LAB — TYPE AND SCREEN
ABO/RH(D): A POS
Antibody Screen: NEGATIVE

## 2020-06-27 LAB — ABO/RH: ABO/RH(D): A POS

## 2020-06-27 SURGERY — ARTHROPLASTY, HIP, TOTAL,POSTERIOR APPROACH
Anesthesia: Spinal | Site: Hip | Laterality: Right

## 2020-06-27 MED ORDER — OXYCODONE HCL 5 MG PO TABS
5.0000 mg | ORAL_TABLET | ORAL | Status: DC | PRN
  Administered 2020-06-27 (×2): 5 mg via ORAL
  Administered 2020-06-27 – 2020-06-28 (×4): 10 mg via ORAL
  Filled 2020-06-27: qty 2
  Filled 2020-06-27: qty 1
  Filled 2020-06-27 (×3): qty 2
  Filled 2020-06-27: qty 1

## 2020-06-27 MED ORDER — APIXABAN 5 MG PO TABS
5.0000 mg | ORAL_TABLET | Freq: Two times a day (BID) | ORAL | Status: DC
  Administered 2020-06-28: 5 mg via ORAL
  Filled 2020-06-27: qty 1

## 2020-06-27 MED ORDER — DEXAMETHASONE SODIUM PHOSPHATE 10 MG/ML IJ SOLN
INTRAMUSCULAR | Status: AC
  Filled 2020-06-27: qty 1

## 2020-06-27 MED ORDER — ALBUMIN HUMAN 5 % IV SOLN
INTRAVENOUS | Status: AC
  Filled 2020-06-27: qty 250

## 2020-06-27 MED ORDER — ONDANSETRON HCL 4 MG/2ML IJ SOLN
INTRAMUSCULAR | Status: AC
  Filled 2020-06-27: qty 2

## 2020-06-27 MED ORDER — MIDAZOLAM HCL 2 MG/2ML IJ SOLN
INTRAMUSCULAR | Status: DC | PRN
  Administered 2020-06-27: 2 mg via INTRAVENOUS

## 2020-06-27 MED ORDER — LIDOCAINE 2% (20 MG/ML) 5 ML SYRINGE
INTRAMUSCULAR | Status: DC | PRN
  Administered 2020-06-27: 40 mg via INTRAVENOUS

## 2020-06-27 MED ORDER — ROSUVASTATIN CALCIUM 10 MG PO TABS: 10.0000 mg | ORAL_TABLET | Freq: Every day | ORAL | Status: DC

## 2020-06-27 MED ORDER — FENTANYL CITRATE (PF) 100 MCG/2ML IJ SOLN
INTRAMUSCULAR | Status: AC
  Filled 2020-06-27: qty 2

## 2020-06-27 MED ORDER — BUPIVACAINE IN DEXTROSE 0.75-8.25 % IT SOLN
INTRATHECAL | Status: DC | PRN
  Administered 2020-06-27: 2 mL via INTRATHECAL

## 2020-06-27 MED ORDER — TRANEXAMIC ACID-NACL 1000-0.7 MG/100ML-% IV SOLN
1000.0000 mg | INTRAVENOUS | Status: AC
  Administered 2020-06-27: 1000 mg via INTRAVENOUS
  Filled 2020-06-27: qty 100

## 2020-06-27 MED ORDER — MENTHOL 3 MG MT LOZG
1.0000 | LOZENGE | OROMUCOSAL | Status: DC | PRN
Start: 2020-06-27 — End: 2020-06-28

## 2020-06-27 MED ORDER — CHLORHEXIDINE GLUCONATE 0.12 % MT SOLN
15.0000 mL | Freq: Once | OROMUCOSAL | Status: AC
  Administered 2020-06-27: 15 mL via OROMUCOSAL

## 2020-06-27 MED ORDER — BUPIVACAINE-EPINEPHRINE (PF) 0.5% -1:200000 IJ SOLN
INTRAMUSCULAR | Status: AC
  Filled 2020-06-27: qty 30

## 2020-06-27 MED ORDER — VANCOMYCIN HCL 10 G IV SOLR
1000.0000 mg | Freq: Once | INTRAVENOUS | Status: AC
  Administered 2020-06-27: 1000 mg via INTRAVENOUS

## 2020-06-27 MED ORDER — HYDROMORPHONE HCL 1 MG/ML IJ SOLN: 0.2500 mg | INTRAMUSCULAR | Status: DC | PRN

## 2020-06-27 MED ORDER — ONDANSETRON HCL 4 MG PO TABS: 4.0000 mg | ORAL_TABLET | Freq: Four times a day (QID) | ORAL | Status: DC | PRN

## 2020-06-27 MED ORDER — WATER FOR IRRIGATION, STERILE IR SOLN
Status: DC | PRN
  Administered 2020-06-27: 1000 mL

## 2020-06-27 MED ORDER — ONDANSETRON HCL 4 MG/2ML IJ SOLN
INTRAMUSCULAR | Status: DC | PRN
  Administered 2020-06-27: 4 mg via INTRAVENOUS

## 2020-06-27 MED ORDER — VANCOMYCIN HCL 1000 MG IV SOLR
INTRAVENOUS | Status: AC
  Filled 2020-06-27: qty 1000

## 2020-06-27 MED ORDER — SORBITOL 70 % SOLN
30.0000 mL | Freq: Every day | Status: DC | PRN
  Filled 2020-06-27: qty 30

## 2020-06-27 MED ORDER — METHOCARBAMOL 500 MG PO TABS
500.0000 mg | ORAL_TABLET | Freq: Four times a day (QID) | ORAL | Status: DC | PRN
  Administered 2020-06-27 – 2020-06-28 (×2): 500 mg via ORAL
  Filled 2020-06-27 (×3): qty 1

## 2020-06-27 MED ORDER — PHENOL 1.4 % MT LIQD: 1.0000 | OROMUCOSAL | Status: DC | PRN

## 2020-06-27 MED ORDER — VANCOMYCIN HCL IN DEXTROSE 1-5 GM/200ML-% IV SOLN
1000.0000 mg | Freq: Two times a day (BID) | INTRAVENOUS | Status: AC
  Administered 2020-06-28: 1000 mg via INTRAVENOUS
  Filled 2020-06-27: qty 200

## 2020-06-27 MED ORDER — DIPHENHYDRAMINE HCL 12.5 MG/5ML PO ELIX: 12.5000 mg | ORAL_SOLUTION | ORAL | Status: DC | PRN

## 2020-06-27 MED ORDER — SODIUM CHLORIDE 0.9 % IV SOLN: INTRAVENOUS | Status: DC

## 2020-06-27 MED ORDER — LISINOPRIL 10 MG PO TABS
10.0000 mg | ORAL_TABLET | Freq: Every day | ORAL | Status: DC
  Administered 2020-06-27: 10 mg via ORAL
  Filled 2020-06-27: qty 1

## 2020-06-27 MED ORDER — MIDAZOLAM HCL 2 MG/2ML IJ SOLN
1.0000 mg | INTRAMUSCULAR | Status: DC
  Administered 2020-06-27 (×2): 1 mg via INTRAVENOUS

## 2020-06-27 MED ORDER — PHENYLEPHRINE 40 MCG/ML (10ML) SYRINGE FOR IV PUSH (FOR BLOOD PRESSURE SUPPORT)
PREFILLED_SYRINGE | INTRAVENOUS | Status: DC | PRN
  Administered 2020-06-27: 40 ug via INTRAVENOUS
  Administered 2020-06-27: 80 ug via INTRAVENOUS
  Administered 2020-06-27: 40 ug via INTRAVENOUS

## 2020-06-27 MED ORDER — BUPIVACAINE-EPINEPHRINE 0.5% -1:200000 IJ SOLN
INTRAMUSCULAR | Status: DC | PRN
  Administered 2020-06-27: 20 mL

## 2020-06-27 MED ORDER — TRANEXAMIC ACID 1000 MG/10ML IV SOLN
INTRAVENOUS | Status: DC | PRN
  Administered 2020-06-27: 2000 mg via TOPICAL

## 2020-06-27 MED ORDER — HYDROMORPHONE HCL 1 MG/ML IJ SOLN
0.5000 mg | INTRAMUSCULAR | Status: DC | PRN
Start: 2020-06-27 — End: 2020-06-28
  Administered 2020-06-27: 1 mg via INTRAVENOUS
  Filled 2020-06-27: qty 1

## 2020-06-27 MED ORDER — BUPIVACAINE LIPOSOME 1.3 % IJ SUSP
INTRAMUSCULAR | Status: DC | PRN
  Administered 2020-06-27: 10 mL

## 2020-06-27 MED ORDER — DOCUSATE SODIUM 100 MG PO CAPS
100.0000 mg | ORAL_CAPSULE | Freq: Two times a day (BID) | ORAL | Status: DC
  Administered 2020-06-27 – 2020-06-28 (×2): 100 mg via ORAL
  Filled 2020-06-27 (×3): qty 1

## 2020-06-27 MED ORDER — ACETAMINOPHEN 325 MG PO TABS: 650.0000 mg | ORAL_TABLET | Freq: Four times a day (QID) | ORAL | 2 refills | Status: AC | PRN

## 2020-06-27 MED ORDER — MEPERIDINE HCL 50 MG/ML IJ SOLN
6.2500 mg | INTRAMUSCULAR | Status: DC | PRN
  Administered 2020-06-27: 6.25 mg via INTRAVENOUS

## 2020-06-27 MED ORDER — PROPOFOL 10 MG/ML IV BOLUS
INTRAVENOUS | Status: AC
  Filled 2020-06-27: qty 20

## 2020-06-27 MED ORDER — ACETAMINOPHEN 500 MG PO TABS
1000.0000 mg | ORAL_TABLET | Freq: Four times a day (QID) | ORAL | Status: AC
  Administered 2020-06-27 – 2020-06-28 (×4): 1000 mg via ORAL
  Filled 2020-06-27 (×4): qty 2

## 2020-06-27 MED ORDER — MIDAZOLAM HCL 2 MG/2ML IJ SOLN
INTRAMUSCULAR | Status: AC
  Filled 2020-06-27: qty 2

## 2020-06-27 MED ORDER — SODIUM CHLORIDE (PF) 0.9 % IJ SOLN
INTRAMUSCULAR | Status: DC | PRN
  Administered 2020-06-27: 20 mL

## 2020-06-27 MED ORDER — ONDANSETRON HCL 4 MG/2ML IJ SOLN: 4.0000 mg | Freq: Four times a day (QID) | INTRAMUSCULAR | Status: DC | PRN

## 2020-06-27 MED ORDER — DOCUSATE SODIUM 100 MG PO CAPS: 100.0000 mg | ORAL_CAPSULE | Freq: Every day | ORAL | 2 refills | Status: AC | PRN

## 2020-06-27 MED ORDER — PHENYLEPHRINE HCL-NACL 10-0.9 MG/250ML-% IV SOLN
INTRAVENOUS | Status: DC | PRN
  Administered 2020-06-27: 50 ug/min via INTRAVENOUS

## 2020-06-27 MED ORDER — METHOCARBAMOL 1000 MG/10ML IJ SOLN
500.0000 mg | Freq: Four times a day (QID) | INTRAVENOUS | Status: DC | PRN
  Filled 2020-06-27: qty 5

## 2020-06-27 MED ORDER — PHENYLEPHRINE 40 MCG/ML (10ML) SYRINGE FOR IV PUSH (FOR BLOOD PRESSURE SUPPORT)
PREFILLED_SYRINGE | INTRAVENOUS | Status: AC
  Filled 2020-06-27: qty 10

## 2020-06-27 MED ORDER — LIDOCAINE HCL (PF) 2 % IJ SOLN
INTRAMUSCULAR | Status: AC
  Filled 2020-06-27: qty 5

## 2020-06-27 MED ORDER — METOCLOPRAMIDE HCL 5 MG/ML IJ SOLN
5.0000 mg | Freq: Three times a day (TID) | INTRAMUSCULAR | Status: DC | PRN
Start: 2020-06-27 — End: 2020-06-28

## 2020-06-27 MED ORDER — DEXAMETHASONE SODIUM PHOSPHATE 10 MG/ML IJ SOLN
INTRAMUSCULAR | Status: DC | PRN
  Administered 2020-06-27: 10 mg via INTRAVENOUS

## 2020-06-27 MED ORDER — ALBUMIN HUMAN 5 % IV SOLN: INTRAVENOUS | Status: DC | PRN

## 2020-06-27 MED ORDER — METOCLOPRAMIDE HCL 5 MG PO TABS: 5.0000 mg | ORAL_TABLET | Freq: Three times a day (TID) | ORAL | Status: DC | PRN

## 2020-06-27 MED ORDER — LACTATED RINGERS IV SOLN: INTRAVENOUS | Status: DC

## 2020-06-27 MED ORDER — SENNOSIDES-DOCUSATE SODIUM 8.6-50 MG PO TABS: 1.0000 | ORAL_TABLET | Freq: Every evening | ORAL | Status: DC | PRN

## 2020-06-27 MED ORDER — MEPERIDINE HCL 50 MG/ML IJ SOLN
INTRAMUSCULAR | Status: AC
  Filled 2020-06-27: qty 1

## 2020-06-27 MED ORDER — PROPOFOL 1000 MG/100ML IV EMUL
INTRAVENOUS | Status: AC
  Filled 2020-06-27: qty 100

## 2020-06-27 MED ORDER — AMLODIPINE BESYLATE 5 MG PO TABS: 5.0000 mg | ORAL_TABLET | Freq: Every day | ORAL | Status: DC

## 2020-06-27 MED ORDER — SODIUM CHLORIDE (PF) 0.9 % IJ SOLN
INTRAMUSCULAR | Status: AC
  Filled 2020-06-27: qty 20

## 2020-06-27 MED ORDER — FLEET ENEMA 7-19 GM/118ML RE ENEM: 1.0000 | ENEMA | Freq: Once | RECTAL | Status: DC | PRN

## 2020-06-27 MED ORDER — PROPOFOL 10 MG/ML IV BOLUS
INTRAVENOUS | Status: DC | PRN
Start: 2020-06-27 — End: 2020-06-27
  Administered 2020-06-27 (×3): 50 mg via INTRAVENOUS

## 2020-06-27 MED ORDER — CEFAZOLIN SODIUM-DEXTROSE 2-4 GM/100ML-% IV SOLN
2.0000 g | INTRAVENOUS | Status: AC
  Administered 2020-06-27: 2 g via INTRAVENOUS
  Filled 2020-06-27: qty 100

## 2020-06-27 MED ORDER — TRANEXAMIC ACID-NACL 1000-0.7 MG/100ML-% IV SOLN
1000.0000 mg | Freq: Once | INTRAVENOUS | Status: AC
  Administered 2020-06-27: 1000 mg via INTRAVENOUS
  Filled 2020-06-27: qty 100

## 2020-06-27 MED ORDER — SODIUM CHLORIDE 0.9 % IR SOLN
Status: DC | PRN
  Administered 2020-06-27: 1000 mL

## 2020-06-27 MED ORDER — PROPOFOL 500 MG/50ML IV EMUL
INTRAVENOUS | Status: DC | PRN
  Administered 2020-06-27: 70 ug/kg/min via INTRAVENOUS

## 2020-06-27 MED ORDER — FENTANYL CITRATE (PF) 100 MCG/2ML IJ SOLN
INTRAMUSCULAR | Status: DC | PRN
  Administered 2020-06-27 (×2): 50 ug via INTRAVENOUS

## 2020-06-27 MED ORDER — ORAL CARE MOUTH RINSE: 15.0000 mL | Freq: Once | OROMUCOSAL | Status: AC

## 2020-06-27 MED ORDER — METHOCARBAMOL 500 MG PO TABS: 500.0000 mg | ORAL_TABLET | Freq: Four times a day (QID) | ORAL | 0 refills | Status: AC | PRN

## 2020-06-27 MED ORDER — VANCOMYCIN HCL IN DEXTROSE 1-5 GM/200ML-% IV SOLN: 1000.0000 mg | Freq: Once | INTRAVENOUS | Status: AC

## 2020-06-27 MED ORDER — OXYCODONE HCL 5 MG PO TABS: ORAL_TABLET | ORAL | 0 refills | Status: AC

## 2020-06-27 MED ORDER — POVIDONE-IODINE 10 % EX SWAB
2.0000 "application " | Freq: Once | CUTANEOUS | Status: AC
  Administered 2020-06-27: 2 via TOPICAL

## 2020-06-27 SURGICAL SUPPLY — 56 items
BAG DECANTER FOR FLEXI CONT (MISCELLANEOUS) ×2 IMPLANT
BAG ZIPLOCK 12X15 (MISCELLANEOUS) ×2 IMPLANT
BENZOIN TINCTURE PRP APPL 2/3 (GAUZE/BANDAGES/DRESSINGS) ×2 IMPLANT
BLADE SAW SAG 73X25 THK (BLADE) ×1
BLADE SAW SGTL 73X25 THK (BLADE) ×1 IMPLANT
CLSR STERI-STRIP ANTIMIC 1/2X4 (GAUZE/BANDAGES/DRESSINGS) ×2 IMPLANT
COVER SURGICAL LIGHT HANDLE (MISCELLANEOUS) ×2 IMPLANT
COVER WAND RF STERILE (DRAPES) IMPLANT
DECANTER SPIKE VIAL GLASS SM (MISCELLANEOUS) ×6 IMPLANT
DRAPE 3/4 80X56 (DRAPES) ×2 IMPLANT
DRAPE INCISE IOBAN 66X45 STRL (DRAPES) ×2 IMPLANT
DRAPE ORTHO SPLIT 77X108 STRL (DRAPES) ×4
DRAPE POUCH INSTRU U-SHP 10X18 (DRAPES) ×2 IMPLANT
DRAPE SURG ORHT 6 SPLT 77X108 (DRAPES) ×2 IMPLANT
DRAPE U-SHAPE 47X51 STRL (DRAPES) ×2 IMPLANT
DRESSING AQUACEL AG SP 3.5X10 (GAUZE/BANDAGES/DRESSINGS) ×1 IMPLANT
DRSG AQUACEL AG ADV 3.5X10 (GAUZE/BANDAGES/DRESSINGS) ×2 IMPLANT
DRSG AQUACEL AG SP 3.5X10 (GAUZE/BANDAGES/DRESSINGS) ×2
DURAPREP 26ML APPLICATOR (WOUND CARE) ×2 IMPLANT
ELECT BLADE TIP CTD 4 INCH (ELECTRODE) ×2 IMPLANT
ELECT REM PT RETURN 15FT ADLT (MISCELLANEOUS) ×2 IMPLANT
FACESHIELD WRAPAROUND (MASK) ×6 IMPLANT
GLOVE BIOGEL PI IND STRL 8 (GLOVE) ×2 IMPLANT
GLOVE BIOGEL PI INDICATOR 8 (GLOVE) ×2
GLOVE SURG ORTHO 8.0 STRL STRW (GLOVE) ×2 IMPLANT
GLOVE SURG SS PI 7.5 STRL IVOR (GLOVE) ×2 IMPLANT
GOWN STRL REUS W/TWL XL LVL3 (GOWN DISPOSABLE) ×4 IMPLANT
HEAD M SROM 36MM 2 (Hips) ×1 IMPLANT
HOLDER FOLEY CATH W/STRAP (MISCELLANEOUS) ×2 IMPLANT
HOOD PEEL AWAY FLYTE STAYCOOL (MISCELLANEOUS) ×2 IMPLANT
IMMOBILIZER KNEE 20 (SOFTGOODS) ×2
IMMOBILIZER KNEE 20 THIGH 36 (SOFTGOODS) ×1 IMPLANT
KIT BASIN OR (CUSTOM PROCEDURE TRAY) ×2 IMPLANT
KIT TURNOVER KIT A (KITS) IMPLANT
LINER NEUTRAL 52X36MM PLUS 4 (Liner) ×2 IMPLANT
MANIFOLD NEPTUNE II (INSTRUMENTS) ×2 IMPLANT
NEEDLE HYPO 22GX1.5 SAFETY (NEEDLE) ×4 IMPLANT
NS IRRIG 1000ML POUR BTL (IV SOLUTION) ×2 IMPLANT
PACK TOTAL JOINT (CUSTOM PROCEDURE TRAY) ×2 IMPLANT
PENCIL SMOKE EVACUATOR (MISCELLANEOUS) IMPLANT
PIN SECTOR W/GRIP ACE CUP 52MM (Hips) ×2 IMPLANT
PROTECTOR NERVE ULNAR (MISCELLANEOUS) ×2 IMPLANT
SROM M HEAD 36MM 2 (Hips) ×2 IMPLANT
STAPLER VISISTAT 35W (STAPLE) IMPLANT
STEM FEM CMNTLSS LG AML 15.0 (Hips) ×2 IMPLANT
SUCTION FRAZIER HANDLE 12FR (TUBING) ×2
SUCTION TUBE FRAZIER 12FR DISP (TUBING) ×1 IMPLANT
SUT ETHIBOND NAB CT1 #1 30IN (SUTURE) ×8 IMPLANT
SUT MNCRL AB 3-0 PS2 18 (SUTURE) ×2 IMPLANT
SUT VIC AB 0 CT1 36 (SUTURE) ×4 IMPLANT
SUT VIC AB 2-0 CT1 27 (SUTURE) ×2
SUT VIC AB 2-0 CT1 TAPERPNT 27 (SUTURE) ×2 IMPLANT
SYR CONTROL 10ML LL (SYRINGE) ×4 IMPLANT
TOWEL OR 17X26 10 PK STRL BLUE (TOWEL DISPOSABLE) ×2 IMPLANT
TRAY FOLEY MTR SLVR 16FR STAT (SET/KITS/TRAYS/PACK) ×2 IMPLANT
WATER STERILE IRR 1000ML POUR (IV SOLUTION) ×4 IMPLANT

## 2020-06-27 NOTE — Anesthesia Procedure Notes (Signed)
Spinal  Patient location during procedure: OR Start time: 06/27/2020 12:40 PM End time: 06/27/2020 12:45 PM Staffing Performed: anesthesiologist  Anesthesiologist: Myrtie Soman, MD Preanesthetic Checklist Completed: patient identified, IV checked, site marked, risks and benefits discussed, surgical consent, monitors and equipment checked, pre-op evaluation and timeout performed Spinal Block Patient position: sitting Prep: Betadine Patient monitoring: heart rate, continuous pulse ox and blood pressure Approach: midline Location: L3-4 Injection technique: single-shot Needle Needle type: Sprotte  Needle gauge: 24 G Needle length: 9 cm Assessment Sensory level: T6 Additional Notes Expiration date of kit checked and confirmed. Patient tolerated procedure well, without complications.

## 2020-06-27 NOTE — Op Note (Signed)
NAME: Anthony Small, ROSAS MEDICAL RECORD [id number] ACCOUNT [id number] DATE OF BIRTH:March 12, 1956 FACILITY: [facility] LOCATION: [id number] PHYSICIAN:W. 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  OPERATION:  Right total hip replacement (AML large stature 50 mm stem with -2 neck length, 36 mm hip ball, 52 mm Gription cup with +4 mm neutral liner).  SURGEON:  Marcie Mowers, MD  ASSISTANTVincent Peyer.  ESTIMATED BLOOD LOSS:  250.  DESCRIPTION OF PROCEDURE:  Lateral position posterior approach to the hip made splitting the iliotibial band and gluteus maximus fascia.  T capsulotomy made in the hip after splitting the external rotators.  Severely contracted hip was noted with severe  deformity of the femoral head with shortening of the leg proximally due to the collapse of the head.  We cut the head.  Potentially, we could not really even do a fingerbreadth above the lesser trochanter due to the erosion, but we did leave some calcar.   We then progressively reamed and broached to accept a 50 mm large stature stem.  Attention was next directed to the acetabulum.  Acetabular retractors were placed anteriorly and inferiorly with wing retractors superiorly and posteriorly.  There was  some posterior erosion oblong acetabulum.  We centralized the acetabulum, progressively reamed that for 1 mm under reaming to accept a 52 mm cup with about 45 degrees of abduction and about 15 degrees of anteversion.  Trial cup was placed and then we  placed the final cup, excellent purchase.  We did not need to use supplemental screws.  We trialed off my normal +4 mm 10 degree liner, and despite the cut being almost flush with the trochanter due to the shortening, we were forced to use a +4 neutral  liner  without the 10-degree and additionally settled on the -2 neck length.  Therefore, when the final prosthesis was applied, our choice on that was a metal hip ball 36 mm.  However, reduction was deemed to be very satisfactory with great stability.  We  could actually forward flex it maximally, adduct it maximally, and he would only start to sublux the hip when he was internally rotated approaching 90 degrees.  Final cup was inserted with the final prosthesis.  Again, we trialed off that.  All  parameters deemed to be acceptable.  Closure was affected with a running Ethibond on the iliotibial band and gluteus maximus.  We did close T capsulotomy as well, subcutaneous tissues with Vicryl and the skin with Monocryl.  Taken to recovery room in  stable condition.  IN/NUANCE  D:06/27/2020 T:06/27/2020 JOB:[id number]

## 2020-06-27 NOTE — Anesthesia Procedure Notes (Signed)
Procedure Name: MAC Date/Time: 06/27/2020 12:36 PM Performed by: Michele Rockers, CRNA Pre-anesthesia Checklist: Patient identified, Emergency Drugs available, Suction available, Timeout performed and Patient being monitored Patient Re-evaluated:Patient Re-evaluated prior to induction Oxygen Delivery Method: Simple face mask

## 2020-06-27 NOTE — Evaluation (Signed)
Physical Therapy Evaluation Patient Details Name: Anthony Small MRN: [id number] DOB: 02-28-1956 Today's Date: 06/27/2020   History of Present Illness  Pt s/p R THR by posterior approach  Clinical Impression  Pt s/p R THR and presents with decreased R LE strength/ROM, post op pain, and posterior THP limiting functional mobility.  Pt should progress to dc home with family assist.    Follow Up Recommendations Follow surgeon's recommendation for DC plan and follow-up therapies    Equipment Recommendations  None recommended by PT    Recommendations for Other Services OT consult     Precautions / Restrictions Precautions Precautions: Posterior Hip Precaution Booklet Issued: Yes (comment) Restrictions Weight Bearing Restrictions: No Other Position/Activity Restrictions: WBAT      Mobility  Bed Mobility Overal bed mobility: Needs Assistance Bed Mobility: Supine to Sit     Supine to sit: Min assist     General bed mobility comments: cues for sequence, use of L LE to self assist and adherence to THP    Transfers Overall transfer level: Needs assistance Equipment used: Rolling walker (2 wheeled) Transfers: Sit to/from Stand Sit to Stand: Min assist         General transfer comment: cues for LE management, adherence to THP and use of UEs to self assist  Ambulation/Gait Ambulation/Gait assistance: Min assist;Min guard Gait Distance (Feet): 86 Feet Assistive device: Rolling walker (2 wheeled) Gait Pattern/deviations: Step-to pattern;Decreased step length - right;Decreased step length - left;Shuffle;Trunk flexed Gait velocity: decr   General Gait Details: cues for sequence, posture and position from [employer]            Wheelchair Mobility    Modified Rankin (Stroke Patients Only)       Balance Overall balance assessment: Mild deficits observed, not formally tested                                           Pertinent Vitals/Pain  Pain Assessment: 0-10 Pain Location: R hip Pain Descriptors / Indicators: Aching;Sore Pain Intervention(s): Limited activity within patient's tolerance;Monitored during session;Premedicated before session;Ice applied    Home Living Family/patient expects to be discharged to:: Private residence Living Arrangements: Alone Available Help at Discharge: Family;Available 24 hours/day Type of Home: House Home Access: Stairs to enter   [employer] of Steps: 1 Home Layout: One level Home Equipment: Walker - 2 wheels;Bedside commode;Cane - single point      Prior Function Level of Independence: Independent               Hand Dominance        Extremity/Trunk Assessment   Upper Extremity Assessment Upper Extremity Assessment: Overall WFL for tasks assessed    Lower Extremity Assessment Lower Extremity Assessment: RLE deficits/detail    Cervical / Trunk Assessment Cervical / Trunk Assessment: Normal  Communication   Communication: No difficulties  Cognition Arousal/Alertness: Awake/alert Behavior During Therapy: WFL for tasks assessed/performed Overall Cognitive Status: Within Functional Limits for tasks assessed                                        General Comments      Exercises Total Joint Exercises Ankle Circles/Pumps: AROM;Both;15 reps;Supine   Assessment/Plan    PT Assessment Patient needs continued PT services  PT Problem List  Decreased strength;Decreased range of motion;Decreased activity tolerance;Decreased balance;Decreased mobility;Decreased knowledge of use of DME;Pain       PT Treatment Interventions DME instruction;Gait training;Stair training;Functional mobility training;Therapeutic activities;Therapeutic exercise;Patient/family education    PT Goals (Current goals can be found in the Care Plan section)  Acute Rehab PT Goals Patient Stated Goal: Regain IND PT Goal Formulation: With patient Time For Goal Achievement:  07/04/20 Potential to Achieve Goals: Good    Frequency 7X/week   Barriers to discharge        Co-evaluation               AM-PAC PT "6 Clicks" Mobility  Outcome Measure Help needed turning from your back to your side while in a flat bed without using bedrails?: A Little Help needed moving from lying on your back to sitting on the side of a flat bed without using bedrails?: A Little Help needed moving to and from a bed to a chair (including a wheelchair)?: A Little Help needed standing up from a chair using your arms (e.g., wheelchair or bedside chair)?: A Little Help needed to walk in hospital room?: A Little Help needed climbing 3-5 steps with a railing? : A Little 6 Click Score: 18    End of Session Equipment Utilized During Treatment: Gait belt Activity Tolerance: Patient tolerated treatment well Patient left: in chair;with call bell/phone within reach;with chair alarm set;with family/visitor present Nurse Communication: Mobility status PT Visit Diagnosis: Difficulty in walking, not elsewhere classified (R26.2)    Time: 1638-4665 PT Time Calculation (min) (ACUTE ONLY): 36 min   Charges:   PT Evaluation $PT Eval Low Complexity: 1 Low PT Treatments $Gait Training: 8-22 mins        Mauro Kaufmann PT Acute Rehabilitation Services Pager [phone] Office [phone]   Alyrica Thurow 06/27/2020, 7:11 PM

## 2020-06-27 NOTE — Transfer of Care (Signed)
Immediate Anesthesia Transfer of Care Note  Patient: Anthony Small  Procedure(s) Performed: TOTAL HIP ARTHROPLASTY (Right Hip)  Patient Location: PACU  Anesthesia Type:Spinal  Level of Consciousness: sedated  Airway & Oxygen Therapy: Patient Spontanous Breathing and Patient connected to face mask oxygen  Post-op Assessment: Report given to RN and Post -op Vital signs reviewed and stable  Post vital signs: Reviewed and stable  Last Vitals:  Vitals Value Taken Time  BP    Temp    Pulse 83 06/27/20 1452  Resp 22 06/27/20 1452  SpO2 100 % 06/27/20 1452  Vitals shown include unvalidated device data.  Last Pain:  Vitals:   06/27/20 0826  TempSrc: Oral  PainSc:          Complications: No complications documented.

## 2020-06-27 NOTE — Interval H&P Note (Signed)
History and Physical Interval Note:  06/27/2020 10:28 AM  Anthony Small  has presented today for surgery, with the diagnosis of OA RIGHT HIP.  The various methods of treatment have been discussed with the patient and family. After consideration of risks, benefits and other options for treatment, the patient has consented to  Procedure(s): TOTAL HIP ARTHROPLASTY (Right) as a surgical intervention.  The patient's history has been reviewed, patient examined, no change in status, stable for surgery.  I have reviewed the patient's chart and labs.  Questions were answered to the patient's satisfaction.     W D Maritta Kief Jr   

## 2020-06-27 NOTE — Interval H&P Note (Signed)
History and Physical Interval Note:  06/27/2020 10:28 AM  Anthony Small  has presented today for surgery, with the diagnosis of OA RIGHT HIP.  The various methods of treatment have been discussed with the patient and family. After consideration of risks, benefits and other options for treatment, the patient has consented to  Procedure(s): TOTAL HIP ARTHROPLASTY (Right) as a surgical intervention.  The patient's history has been reviewed, patient examined, no change in status, stable for surgery.  I have reviewed the patient's chart and labs.  Questions were answered to the patient's satisfaction.     Thera Flake

## 2020-06-27 NOTE — Discharge Instructions (Signed)

## 2020-06-27 NOTE — Brief Op Note (Signed)
06/27/2020  2:53 PM  PATIENT:  Anthony Small  64 y.o. male  PRE-OPERATIVE DIAGNOSIS:  OA RIGHT HIP  POST-OPERATIVE DIAGNOSIS:  OA RIGHT HIP  PROCEDURE:  Procedure(s): TOTAL HIP ARTHROPLASTY (Right)  SURGEON:  Surgeon(s) and Role:    Frederico Hamman, MD - Primary  PHYSICIAN ASSISTANT: Margart Sickles, PA-C  ASSISTANTS: OR staff x1   ANESTHESIA:   local, spinal and IV sedation  EBL:  300 mL   BLOOD ADMINISTERED:none  DRAINS: none   LOCAL MEDICATIONS USED:  MARCAINE     SPECIMEN:  No Specimen  DISPOSITION OF SPECIMEN:  N/A  COUNTS:  YES  TOURNIQUET:  * No tourniquets in log *  DICTATION: .Other Dictation: Dictation Number unknown  PLAN OF CARE: Admit for overnight observation  PATIENT DISPOSITION:  PACU - hemodynamically stable.   Delay start of Pharmacological VTE agent (>24hrs) due to surgical blood loss or risk of bleeding: yes

## 2020-06-27 NOTE — Anesthesia Preprocedure Evaluation (Signed)
Anesthesia Evaluation  Patient identified by MRN, date of birth, ID band Patient awake    Reviewed: Allergy & Precautions, H&P , NPO status , Patient's Chart, lab work & pertinent test results  Airway Mallampati: II  TM Distance: >3 FB Neck ROM: Full    Dental no notable dental hx.    Pulmonary Current Smoker,    Pulmonary exam normal breath sounds clear to auscultation       Cardiovascular hypertension, + DVT  Normal cardiovascular exam Rhythm:Regular Rate:Normal     Neuro/Psych negative neurological ROS  negative psych ROS   GI/Hepatic negative GI ROS, Neg liver ROS,   Endo/Other  negative endocrine ROS  Renal/GU negative Renal ROS  negative genitourinary   Musculoskeletal negative musculoskeletal ROS (+)   Abdominal   Peds negative pediatric ROS (+)  Hematology negative hematology ROS (+)   Anesthesia Other Findings   Reproductive/Obstetrics negative OB ROS                             Anesthesia Physical Anesthesia Plan  ASA: III  Anesthesia Plan: Spinal   Post-op Pain Management:    Induction: Intravenous  PONV Risk Score and Plan: 2 and Ondansetron, Dexamethasone and Treatment may vary due to age or medical condition  Airway Management Planned: Simple Face Mask  Additional Equipment:   Intra-op Plan:   Post-operative Plan:   Informed Consent: I have reviewed the patients History and Physical, chart, labs and discussed the procedure including the risks, benefits and alternatives for the proposed anesthesia with the patient or authorized representative who has indicated his/her understanding and acceptance.     Dental advisory given  Plan Discussed with: CRNA and Surgeon  Anesthesia Plan Comments:         Anesthesia Quick Evaluation

## 2020-06-28 ENCOUNTER — Encounter (HOSPITAL_COMMUNITY): Payer: Self-pay | Admitting: Orthopedic Surgery

## 2020-06-28 DIAGNOSIS — M1611 Unilateral primary osteoarthritis, right hip: Secondary | ICD-10-CM | POA: Diagnosis not present

## 2020-06-28 NOTE — Progress Notes (Signed)
Patient discharged to home w/ family. Given all belongings, isntructions. Verbalized understanding of instructions and precautions. Escorted to pov via w/c.

## 2020-06-28 NOTE — Anesthesia Postprocedure Evaluation (Signed)
Anesthesia Post Note  Patient: Anthony Small  Procedure(s) Performed: TOTAL HIP ARTHROPLASTY (Right Hip)     Patient location during evaluation: PACU Anesthesia Type: Spinal Level of consciousness: oriented and awake and alert Pain management: pain level controlled Vital Signs Assessment: post-procedure vital signs reviewed and stable Respiratory status: spontaneous breathing, respiratory function stable and patient connected to nasal cannula oxygen Cardiovascular status: blood pressure returned to baseline and stable Postop Assessment: no headache, no backache and no apparent nausea or vomiting Anesthetic complications: no   No complications documented.  Last Vitals:  Vitals:   06/28/20 0153 06/28/20 0640  BP: 120/68 111/76  Pulse: 100 76  Resp: 15 17  Temp: 36.6 C 36.6 C  SpO2: 98% 98%    Last Pain:  Vitals:   06/28/20 0922  TempSrc:   PainSc: 6                  Giabella Duhart S

## 2020-06-28 NOTE — Progress Notes (Signed)
Physical Therapy Treatment Patient Details Name: Anthony Small MRN: [id number] DOB: January 27, 1956 Today's Date: 06/28/2020    History of Present Illness Pt s/p R THR by posterior approach    PT Comments    Pt progressing well with mobility and with good awareness of THP.  Pt up to ambulate in hall, performed HEP with assist, negotiated stairs, and reviewed car transfers.  Pt eager for dc home and to follow up with OP PT per physician's direction.   Follow Up Recommendations  Follow surgeon's recommendation for DC plan and follow-up therapies     Equipment Recommendations  None recommended by PT    Recommendations for Other Services OT consult     Precautions / Restrictions Precautions Precautions: Posterior Hip Precaution Booklet Issued: Yes (comment) Precaution Comments: Pt recalls 2/3 THP without cues; all precautions reviewed Restrictions Weight Bearing Restrictions: No Other Position/Activity Restrictions: WBAT    Mobility  Bed Mobility Overal bed mobility: Needs Assistance Bed Mobility: Supine to Sit     Supine to sit: Supervision     General bed mobility comments: INcreased time with use of belt and cues for sequence, use of L LE to self assist and adherence to THP  Transfers Overall transfer level: Needs assistance Equipment used: Rolling walker (2 wheeled) Transfers: Sit to/from Stand Sit to Stand: Min guard;Supervision         General transfer comment: cues for LE management, adherence to THP and use of UEs to self assist  Ambulation/Gait Ambulation/Gait assistance: Min guard;Supervision Gait Distance (Feet): 120 Feet Assistive device: Rolling walker (2 wheeled) Gait Pattern/deviations: Step-to pattern;Decreased step length - right;Decreased step length - left;Shuffle;Trunk flexed Gait velocity: decr   General Gait Details: cues for sequence, posture and position from RW   Stairs Stairs: Yes Stairs assistance: Min assist Stair Management:  No rails;Step to pattern;Backwards;With walker Number of Stairs: 2 General stair comments: single step twice with cues for sequence and foot/RW placement   Wheelchair Mobility    Modified Rankin (Stroke Patients Only)       Balance Overall balance assessment: Mild deficits observed, not formally tested                                          Cognition Arousal/Alertness: Awake/alert Behavior During Therapy: WFL for tasks assessed/performed Overall Cognitive Status: Within Functional Limits for tasks assessed                                        Exercises Total Joint Exercises Ankle Circles/Pumps: AROM;Both;15 reps;Supine Quad Sets: AROM;Both;10 reps;Supine Heel Slides: AAROM;Right;20 reps;Supine Hip ABduction/ADduction: AAROM;Right;15 reps;Supine Long Arc Quad: AROM;Right;10 reps;Seated    General Comments        Pertinent Vitals/Pain Pain Assessment: 0-10 Pain Score: 5  Pain Location: R hip Pain Descriptors / Indicators: Aching;Sore Pain Intervention(s): Limited activity within patient's tolerance;Monitored during session;Premedicated before session;Ice applied    Home Living                      Prior Function            PT Goals (current goals can now be found in the care plan section) Acute Rehab PT Goals Patient Stated Goal: Regain IND PT Goal Formulation: With patient Time For Goal Achievement: 07/04/20 Potential  to Achieve Goals: Good Progress towards PT goals: Progressing toward goals    Frequency    7X/week      PT Plan Current plan remains appropriate    Co-evaluation              AM-PAC PT "6 Clicks" Mobility   Outcome Measure  Help needed turning from your back to your side while in a flat bed without using bedrails?: A Little Help needed moving from lying on your back to sitting on the side of a flat bed without using bedrails?: A Little Help needed moving to and from a bed to a  chair (including a wheelchair)?: A Little Help needed standing up from a chair using your arms (e.g., wheelchair or bedside chair)?: A Little Help needed to walk in hospital room?: A Little Help needed climbing 3-5 steps with a railing? : A Little 6 Click Score: 18    End of Session Equipment Utilized During Treatment: Gait belt Activity Tolerance: Patient tolerated treatment well Patient left: in chair;with call bell/phone within reach;with chair alarm set;with family/visitor present Nurse Communication: Mobility status PT Visit Diagnosis: Difficulty in walking, not elsewhere classified (R26.2)     Time: 1020-1105 PT Time Calculation (min) (ACUTE ONLY): 45 min  Charges:  $Gait Training: 8-22 mins $Therapeutic Exercise: 8-22 mins $Therapeutic Activity: 8-22 mins                     Mauro Kaufmann PT Acute Rehabilitation Services Pager [phone] Office [phone]    Anthony Small 06/28/2020, 1:15 PM

## 2021-04-12 IMAGING — DX DG HIP (WITH OR WITHOUT PELVIS) 1V PORT*R*
1 series · 1 of 1 positions shown · non-contrast
Comparison: None.

CLINICAL DATA: Status post hip arthroplasty.

EXAM:
PORTABLE PELVIS 1-2 VIEWS; DG HIP (WITH OR WITHOUT PELVIS) 1V PORT
RIGHT

[hip frog leg]
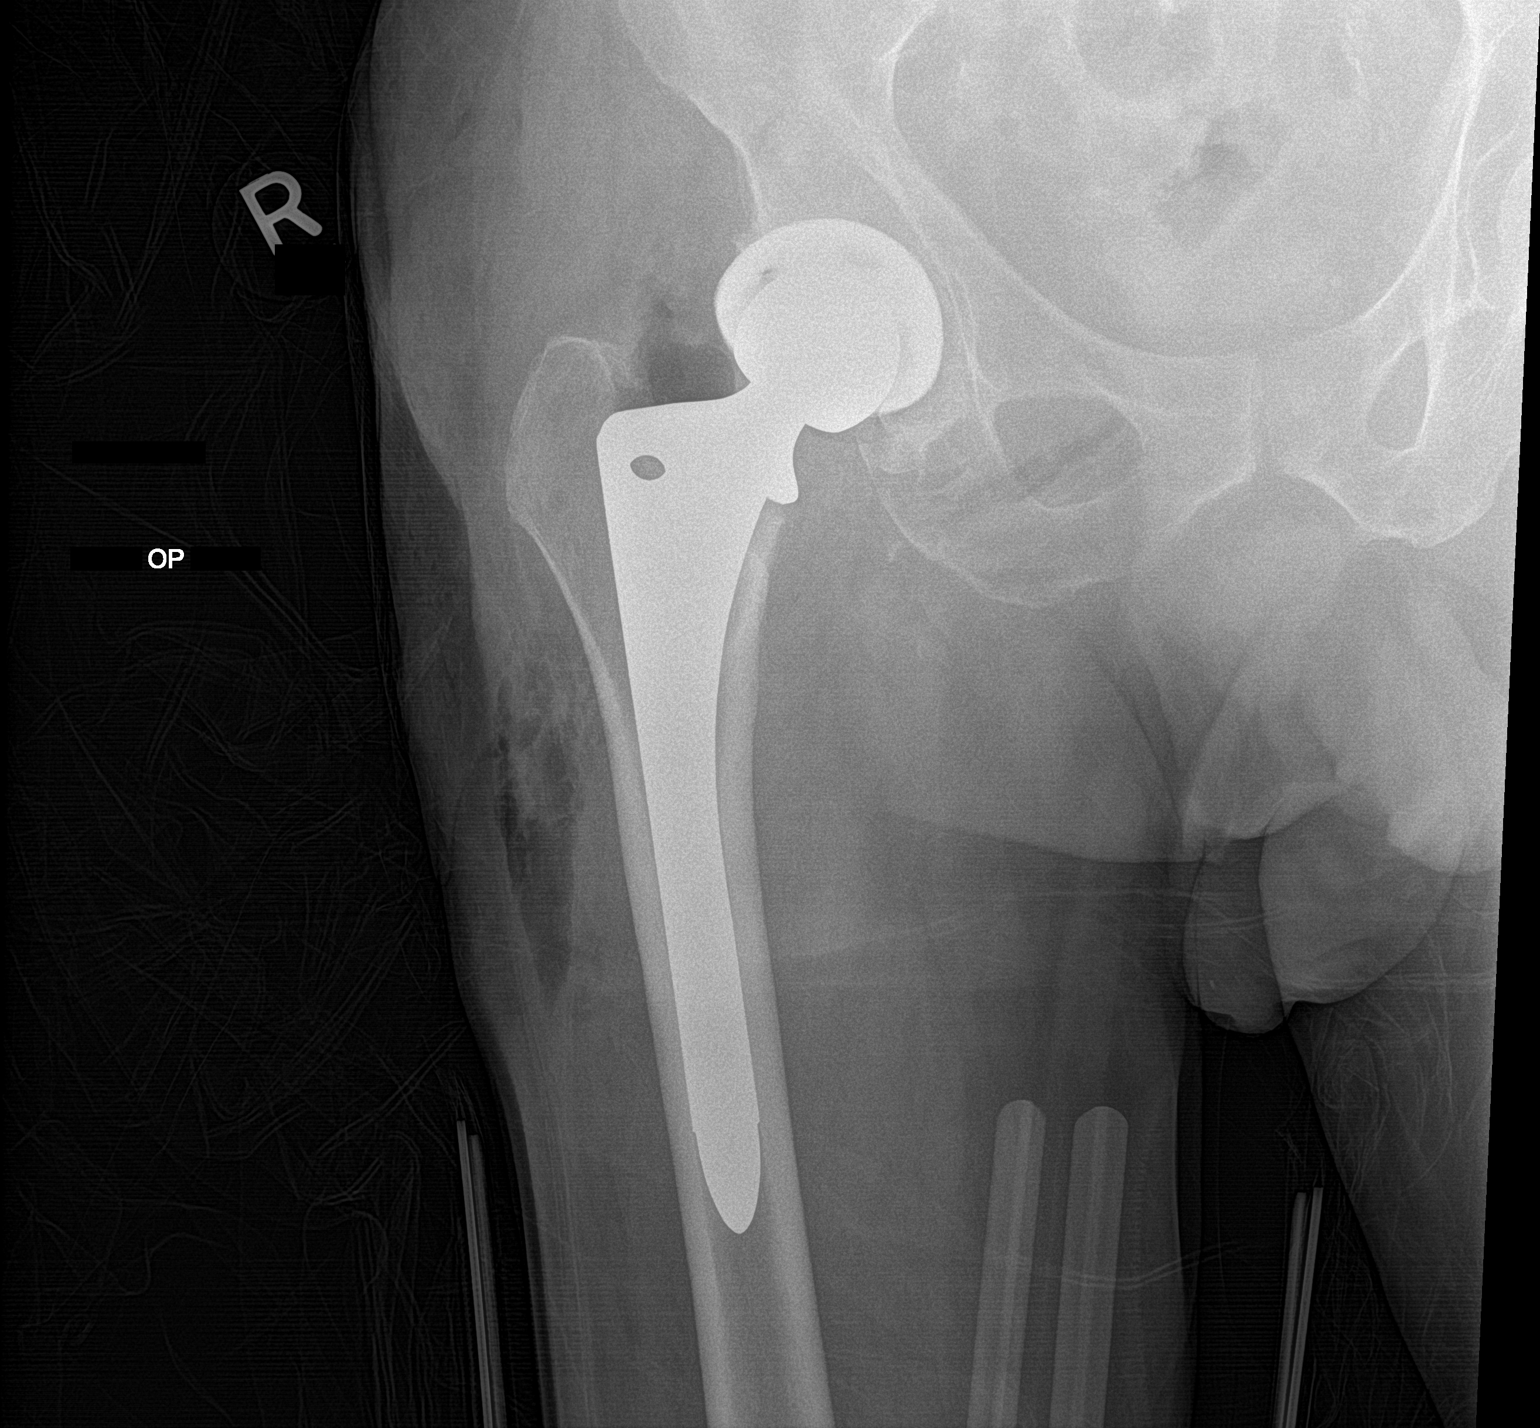

[1 of 1 positions shown; findings below may reference images not displayed]

FINDINGS: Right hip arthroplasty in expected alignment. The femoral stem is
midline. There is no periprosthetic lucency or fracture. Recent
postsurgical change includes air and edema in the soft tissues and
joint space. Intact pubic rami.
IMPRESSION: Right hip arthroplasty in expected alignment without immediate
postoperative complication.

## 2021-04-12 IMAGING — DX DG PORTABLE PELVIS
1 series · 1 of 1 positions shown · non-contrast
Comparison: None.

CLINICAL DATA: Status post hip arthroplasty.

EXAM:
PORTABLE PELVIS 1-2 VIEWS; DG HIP (WITH OR WITHOUT PELVIS) 1V PORT
RIGHT

[pelvis ap]
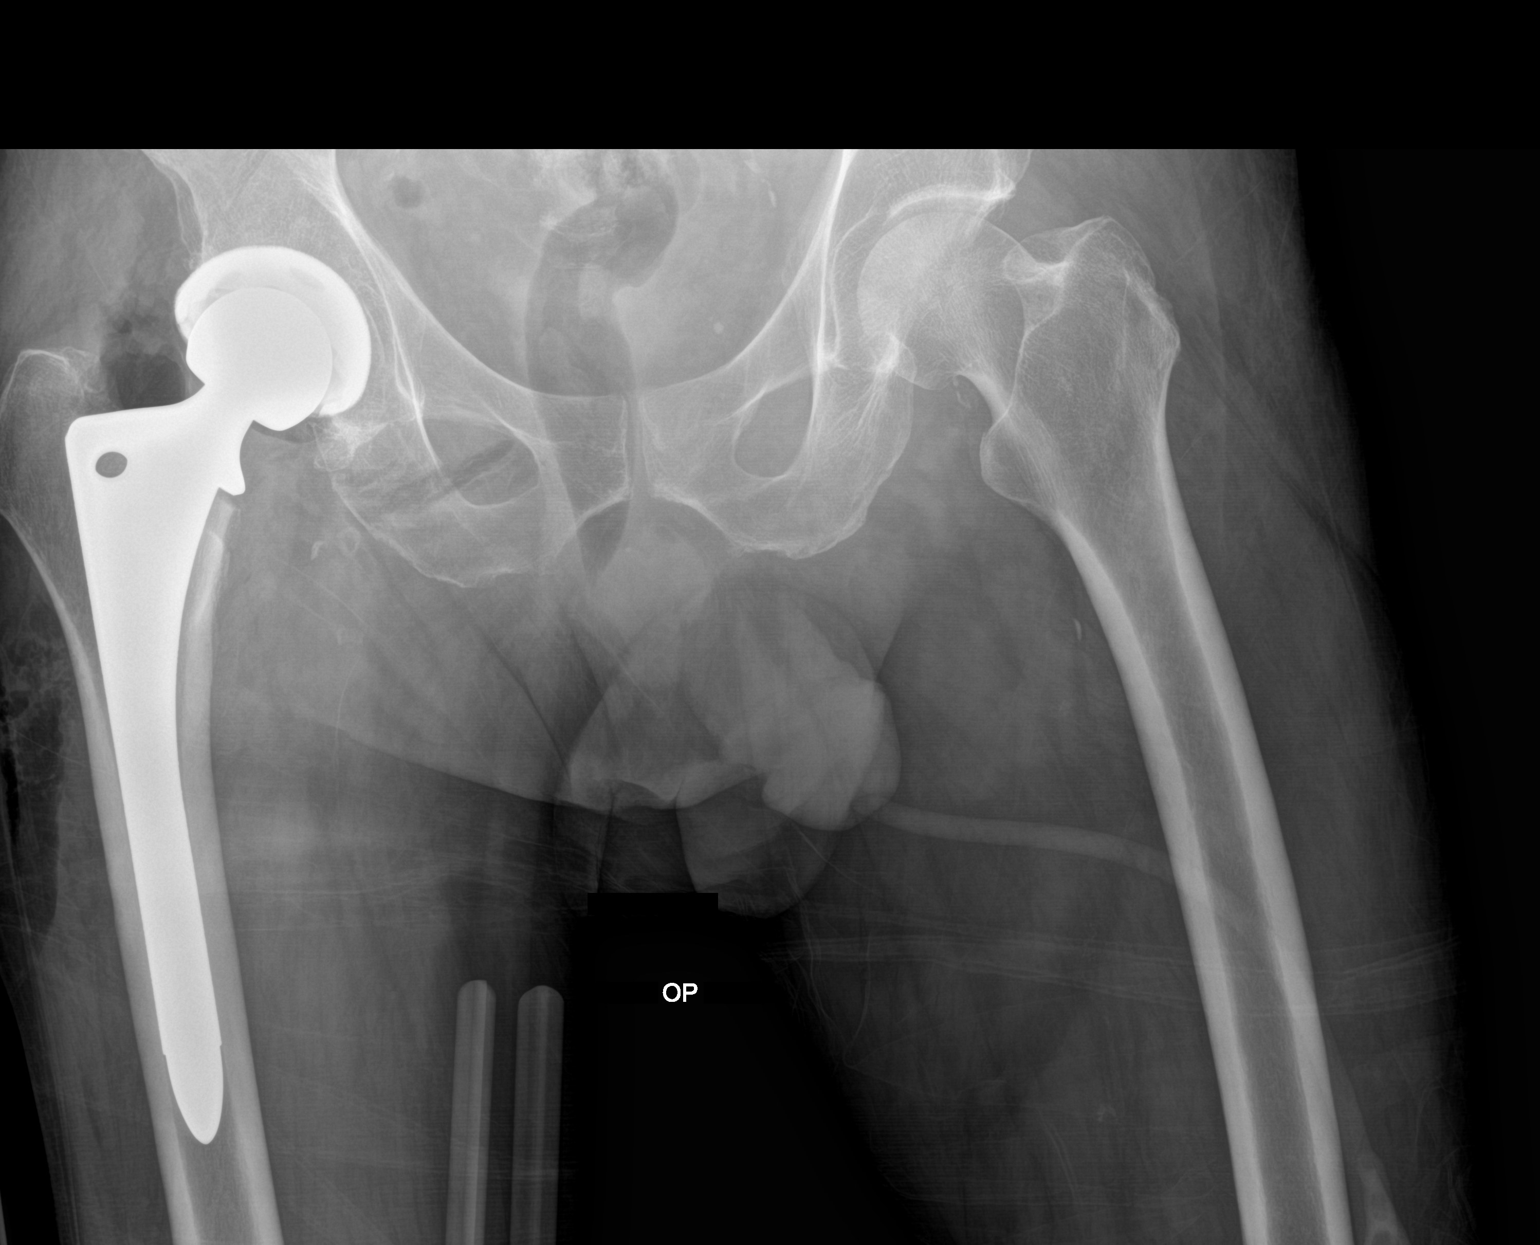

[1 of 1 positions shown; findings below may reference images not displayed]

FINDINGS: Right hip arthroplasty in expected alignment. The femoral stem is
midline. There is no periprosthetic lucency or fracture. Recent
postsurgical change includes air and edema in the soft tissues and
joint space. Intact pubic rami.
IMPRESSION: Right hip arthroplasty in expected alignment without immediate
postoperative complication.

## 2022-03-11 ENCOUNTER — Other Ambulatory Visit (HOSPITAL_COMMUNITY): Payer: Self-pay

## 2022-03-11 ENCOUNTER — Other Ambulatory Visit: Payer: Self-pay

## 2022-03-11 ENCOUNTER — Emergency Department (HOSPITAL_COMMUNITY)
Admission: EM | Admit: 2022-03-11 | Discharge: 2022-03-11 | Disposition: A | Discharge status: Home / Self Care | Payer: BC Managed Care – PPO | Attending: Emergency Medicine | Admitting: Emergency Medicine

## 2022-03-11 ENCOUNTER — Emergency Department (HOSPITAL_BASED_OUTPATIENT_CLINIC_OR_DEPARTMENT_OTHER): Payer: BC Managed Care – PPO

## 2022-03-11 DIAGNOSIS — I824Y2 Acute embolism and thrombosis of unspecified deep veins of left proximal lower extremity: Secondary | ICD-10-CM | POA: Insufficient documentation

## 2022-03-11 DIAGNOSIS — R609 Edema, unspecified: Secondary | ICD-10-CM | POA: Diagnosis not present

## 2022-03-11 DIAGNOSIS — M7989 Other specified soft tissue disorders: Secondary | ICD-10-CM

## 2022-03-11 DIAGNOSIS — R739 Hyperglycemia, unspecified: Secondary | ICD-10-CM | POA: Diagnosis not present

## 2022-03-11 DIAGNOSIS — M79605 Pain in left leg: Secondary | ICD-10-CM | POA: Diagnosis present

## 2022-03-11 LAB — CBC WITH DIFFERENTIAL/PLATELET
Abs Immature Granulocytes: 0.02 10*3/uL (ref 0.00–0.07)
Basophils Absolute: 0 10*3/uL (ref 0.0–0.1)
Basophils Relative: 0 %
Eosinophils Absolute: 0 10*3/uL (ref 0.0–0.5)
Eosinophils Relative: 0 %
HCT: 41.6 % (ref 39.0–52.0)
Hemoglobin: 14.8 g/dL (ref 13.0–17.0)
Immature Granulocytes: 0 %
Lymphocytes Relative: 21 %
Lymphs Abs: 1.5 10*3/uL (ref 0.7–4.0)
MCH: 37.2 pg — ABNORMAL HIGH (ref 26.0–34.0)
MCHC: 35.6 g/dL (ref 30.0–36.0)
MCV: 104.5 fL — ABNORMAL HIGH (ref 80.0–100.0)
Monocytes Absolute: 0.6 10*3/uL (ref 0.1–1.0)
Monocytes Relative: 8 %
Neutro Abs: 4.8 10*3/uL (ref 1.7–7.7)
Neutrophils Relative %: 71 %
Platelets: 150 10*3/uL (ref 150–400)
RBC: 3.98 MIL/uL — ABNORMAL LOW (ref 4.22–5.81)
RDW: 14.3 % (ref 11.5–15.5)
WBC: 6.8 10*3/uL (ref 4.0–10.5)
nRBC: 0 % (ref 0.0–0.2)

## 2022-03-11 LAB — BASIC METABOLIC PANEL
Anion gap: 10 (ref 5–15)
BUN: 5 mg/dL — ABNORMAL LOW (ref 8–23)
CO2: 25 mmol/L (ref 22–32)
Calcium: 9.3 mg/dL (ref 8.9–10.3)
Chloride: 106 mmol/L (ref 98–111)
Creatinine, Ser: 0.85 mg/dL (ref 0.61–1.24)
GFR, Estimated: >60 mL/min (ref >60)
Glucose, Bld: 113 mg/dL — ABNORMAL HIGH (ref 70–99)
Potassium: 5.1 mmol/L (ref 3.5–5.1)
Sodium: 141 mmol/L (ref 135–145)

## 2022-03-11 MED ORDER — APIXABAN (ELIQUIS) VTE STARTER PACK (10MG AND 5MG)
ORAL_TABLET | ORAL | 0 refills | Status: DC
  Filled 2022-03-11: qty 74, 28d supply, fill #0

## 2022-03-11 MED ORDER — APIXABAN (ELIQUIS) VTE STARTER PACK (10MG AND 5MG)
ORAL_TABLET | ORAL | 0 refills | Status: AC
  Filled 2022-03-11: qty 74, 28d supply, fill #0

## 2022-03-11 MED ORDER — APIXABAN (ELIQUIS) VTE STARTER PACK (10MG AND 5MG): ORAL_TABLET | ORAL | 0 refills | Status: AC

## 2022-03-11 MED ORDER — APIXABAN 2.5 MG PO TABS: 5.0000 mg | ORAL_TABLET | Freq: Two times a day (BID) | ORAL | 0 refills | Status: DC

## 2022-03-11 NOTE — ED Notes (Signed)
Pt verbalizes understanding of discharge instructions. Opportunity for questions and answers were provided. Pt discharged from the ED.   ?

## 2022-03-11 NOTE — ED Provider Notes (Signed)
I provided a substantive portion of the care of this patient.  I personally performed the entirety of the medical decision making for this encounter.      Patient here with evidence of left lower extremity DVT.  Patient has not had significant short of breath.  Said no chest pain.  Low suspicion for actually PE.  Heart rate in the room is in the low 90s.  Long discussion with patient and his daughter about if patient needs to have a CT scan to rule out PE.  Shared decision making done with them.  Discussed revolved around the fact that even if patient does have a PE he would likely be sent home on Eliquis anyway given that he is asymptomatic and has stable vital signs.  They are agreeable to restarting Eliquis at this time and will return if he becomes worse.   Lorre Nick, MD 03/11/22 1104

## 2022-03-11 NOTE — ED Provider Triage Note (Signed)
Emergency Medicine Provider Triage Evaluation Note  Anthony Small , a 66 y.o. male  was evaluated in triage.  Pt complains of left calf pain and swelling. Symptoms started yesterday. Hx of prior PE and DVT 8 years ago. Was on eliquis until 2 months ago. No chest pain, brief Sob yesterday when pulling in trash cans, but othereise denies.  Review of Systems  Positive: Calf pain, swelling Negative: Chest pain  Physical Exam  BP (!) 151/103 (BP Location: Right Arm)   Pulse (!) 110   Temp 98.5 F (36.9 C) (Oral)   Resp 18   Ht 5\' 8"  (1.727 m)   Wt 79.4 kg   SpO2 96%   BMI 26.61 kg/m  Gen:   Awake, no distress   Resp:  Normal effort  MSK:   Moves extremities without difficulty, left calf tenderness and swelling, warm and well perfused Other:    Medical Decision Making  Medically screening exam initiated at 9:12 AM.  Appropriate orders placed.  Anthony Small was informed that the remainder of the evaluation will be completed by another provider, this initial triage assessment does not replace that evaluation, and the importance of remaining in the ED until their evaluation is complete.  Basic labs and DVT Gwenette Greet ordered   US, Dartha Lodge 03/11/22 03/13/22

## 2022-03-11 NOTE — Discharge Instructions (Addendum)
Diagnosed with deep venous thrombosis (clot) of the left leg. Reordered you home dose of Eliquis which you should begin taking today. Strongly recommend you follow up with your PCP regarding restarting blood thinner medication for DVT. If you develop new chest pain or shortness of breath please return immediately to the emergency department for evaluation as these are concerning symptoms for pulmonary embolism related to your DVT.

## 2022-03-11 NOTE — Progress Notes (Signed)
Lower extremity venous has been completed.   Preliminary results in CV Proc.   Anthony Small 03/11/2022 9:49 AM

## 2022-03-11 NOTE — ED Triage Notes (Signed)
Pt. Stated, I woke up yesterday left calf of my leg was hurting and now its hurting more and some swelling. I had a blood clot 8 years ago.

## 2022-03-11 NOTE — ED Provider Notes (Signed)
[facility] EMERGENCY DEPARTMENT Provider Note   CSN: [id number] Arrival date & time: 03/11/22  0849     History  Chief Complaint  Patient presents with   Leg Pain   calf pain   HPI Anthony Small is a 66 y.o. male with history of DVT and PE 8 years ago presenting for left leg pain.  Pain started yesterday when he woke from sleep.  Located in the left mid calf.  It's sore and tender.  Denies swelling or redness of the left calf.  Reports that he has been on Eliquis for the past 8 years but was discontinued by his medical provider 1 month ago.  Denies shortness of breath and chest pain.      Leg Pain      Home Medications Prior to Admission medications   Medication Sig Start Date End Date Taking? Authorizing Provider  APIXABAN (ELIQUIS) VTE STARTER PACK (10MG  AND 5MG ) Take as directed on package: start with two-5mg  tablets twice daily for 7 days. On day 8, switch to one-5mg  tablet twice daily. 03/11/22  Yes , PA-C  amLODipine (NORVASC) 5 MG tablet Take 5 mg by mouth daily. 03/20/20   [provider]  lisinopril (ZESTRIL) 10 MG tablet Take 10 mg by mouth daily. 03/22/20   [provider]  methocarbamol (ROBAXIN) 500 MG tablet Take 1 tablet (500 mg total) by mouth every 6 (six) hours as needed for muscle spasms. 06/27/20   Chadwell, 05/22/20, PA-C  oxyCODONE (OXY IR/ROXICODONE) 5 MG immediate release tablet Take one tab po q4-6hrs prn pain, may need 1-2 first couple weeks, max 8 tabs in 24hrs 06/27/20   Chadwell, Ivin Booty, PA-C  rosuvastatin (CRESTOR) 10 MG tablet Take 10 mg by mouth at bedtime. 03/28/20   [provider]      Allergies    Bee venom    Review of Systems   Review of Systems  Physical Exam Updated Vital Signs BP (!) 143/97   Pulse 71   Temp 98.5 F (36.9 C) (Oral)   Resp 16   Ht 5\' 8"  (1.727 m)   Wt 79.4 kg   SpO2 97%   BMI 26.61 kg/m  Physical Exam  ED Results / Procedures / Treatments    Labs (all labs ordered are listed, but only abnormal results are displayed) Labs Reviewed  BASIC METABOLIC PANEL - Abnormal; Notable for the following components:      Result Value   Glucose, Bld 113 (*)    BUN 5 (*)    All other components within normal limits  CBC WITH DIFFERENTIAL/PLATELET - Abnormal; Notable for the following components:   RBC 3.98 (*)    MCV 104.5 (*)    MCH 37.2 (*)    All other components within normal limits    EKG None  Radiology VAS Ivin Booty LOWER EXTREMITY VENOUS (DVT) (ONLY [facility] & [facility])  Result Date: 03/11/2022  Lower Venous DVT Study Patient Name:  Anthony Small  Date of Exam:   03/11/2022 Medical Rec #: [id number]          Accession #:    [id number] Bolk Date of Birth: [birth year]          Patient Gender: M Patient Age:   73 years Exam Location:  [facility] Procedure:      VAS 12/28/1955 LOWER EXTREMITY VENOUS (DVT) Referring Phys: [id number] --------------------------------------------------------------------------------  Indications: Swelling, and Edema.  Comparison Study: no prior Performing Technologist: [facility]  Examination Guidelines: A complete evaluation includes B-mode imaging, spectral Doppler, color Doppler, and power Doppler as needed of all accessible portions of each vessel. Bilateral testing is considered an integral part of a complete examination. Limited examinations for reoccurring indications may be performed as noted. The reflux portion of the exam is performed with the patient in reverse Trendelenburg.  +-----+---------------+---------+-----------+----------+--------------+ RIGHTCompressibilityPhasicitySpontaneityPropertiesThrombus Aging +-----+---------------+---------+-----------+----------+--------------+ CFV  Full           Yes      Yes                                 +-----+---------------+---------+-----------+----------+--------------+   +---------+---------------+---------+-----------+----------+-----------------+  LEFT     CompressibilityPhasicitySpontaneityPropertiesThrombus Aging    +---------+---------------+---------+-----------+----------+-----------------+ CFV      Full           Yes      Yes                                    +---------+---------------+---------+-----------+----------+-----------------+ SFJ      Full                                                           +---------+---------------+---------+-----------+----------+-----------------+ FV Prox  Full                                                           +---------+---------------+---------+-----------+----------+-----------------+ FV Mid   Full                                                           +---------+---------------+---------+-----------+----------+-----------------+ FV DistalFull                                                           +---------+---------------+---------+-----------+----------+-----------------+ PFV      Full                                                           +---------+---------------+---------+-----------+----------+-----------------+ POP      None           Yes      Yes                  Age Indeterminate +---------+---------------+---------+-----------+----------+-----------------+ PTV      None  Age Indeterminate +---------+---------------+---------+-----------+----------+-----------------+ PERO     None                                         Age Indeterminate +---------+---------------+---------+-----------+----------+-----------------+    Summary: RIGHT: - No evidence of common femoral vein obstruction.  LEFT: - Findings consistent with age indeterminate deep vein thrombosis involving the left popliteal vein, left posterior tibial veins, and left peroneal veins. - No cystic structure found in the popliteal fossa.  *See table(s) above for measurements and observations.    Preliminary      Procedures Procedures    Medications Ordered in ED Medications - No data to display  ED Course/ Medical Decision Making/ A&P                           Medical Decision Making  This patient presents to the ED for concern of leg pain, this involves a number of treatment options, and is a complaint that carries with it a moderate risk of complications and morbidity.  The differential diagnosis includes DVT, PAD, and trauma.    Co morbidities: Discussed in HPI   EMR reviewed including pt PMHx, past surgical history and past visits to ER.   See HPI for more details   Lab Tests:   I ordered and independently interpreted labs. Labs notable for hyperglycemia.   Imaging Studies:  Abnormal findings. I personally reviewed all imaging studies. Imaging notable for: findings consistent with age indeterminate deep vein thrombosis involving the left popliteal vein, left posterior tibial veins, and left peroneal veins    Cardiac Monitoring:  The patient was maintained on a cardiac monitor.  I personally viewed and interpreted the cardiac monitored which showed an underlying rhythm of: NSR NA   Medicines ordered:  I ordered medication including Eliquis for DVT.  Reevaluation of the patient after these medicines showed that the patient stayed the same I have reviewed the patients home medicines and have made adjustments as needed   Consults/Attending Physician   I discussed this case with my attending physician who cosigned this note including patient's presenting symptoms, physical exam, and planned diagnostics and interventions. Attending physician stated agreement with plan or made changes to plan which were implemented.   Reevaluation:  After the interventions noted above I re-evaluated patient and found that they have :stayed the same     Problem List / ED Course:  Patient presented for leg pain.  Ultrasound revealed DVT in the left leg.  We did discuss his  history of DVT and associated PE.  Patient was asymptomatic with no shortness of breath or chest pain.  Also vitals were normal with normal heart rate.  Had a shared decision making conversation regarding restarting his Eliquis and returning to the ED if he develop new shortness of breath or chest pain as this would be concerning for likely pulmonary embolism.    Dispostion:  After consideration of the diagnostic results and the patients response to treatment, I feel that the patient would benefit from restarting Eliquis for new DVT in the left leg and close follow-up with PCP in the next few days.         Final Clinical Impression(s) / ED Diagnoses Final diagnoses:  Acute deep vein thrombosis (DVT) of proximal vein of left lower extremity (HCC)    Rx / DC Orders ED Discharge  Orders          Ordered    apixaban (ELIQUIS) 2.5 MG TABS tablet  2 times daily,   Status:  Discontinued        03/11/22 1114    APIXABAN (ELIQUIS) VTE STARTER PACK (10MG  AND 5MG )        03/11/22 1119              , PA-C 03/11/22 1121    Gareth Eagle, MD 03/12/22 [phone]

## 2022-04-02 ENCOUNTER — Other Ambulatory Visit (HOSPITAL_COMMUNITY): Payer: Self-pay

## 2022-04-02 MED ORDER — ROSUVASTATIN CALCIUM 10 MG PO TABS
10.0000 mg | ORAL_TABLET | Freq: Every day | ORAL | 1 refills | Status: DC
  Filled 2022-04-02: qty 90, 90d supply, fill #0
  Filled 2022-06-30: qty 90, 90d supply, fill #1

## 2022-04-02 MED ORDER — LISINOPRIL 10 MG PO TABS
10.0000 mg | ORAL_TABLET | Freq: Every day | ORAL | 1 refills | Status: DC
Start: 2022-04-02 — End: 2022-09-25
  Filled 2022-04-02: qty 90, 90d supply, fill #0
  Filled 2022-06-30: qty 90, 90d supply, fill #1

## 2022-04-02 MED ORDER — ELIQUIS 5 MG PO TABS
5.0000 mg | ORAL_TABLET | Freq: Two times a day (BID) | ORAL | 1 refills | Status: DC
  Filled 2022-04-02: qty 60, 30d supply, fill #0
  Filled 2022-05-05: qty 60, 30d supply, fill #1
  Filled 2022-06-03: qty 60, 30d supply, fill #2
  Filled 2022-06-30: qty 60, 30d supply, fill #3
  Filled 2022-08-04: qty 60, 30d supply, fill #4
  Filled 2022-09-02: qty 60, 30d supply, fill #5

## 2022-04-04 ENCOUNTER — Other Ambulatory Visit (HOSPITAL_COMMUNITY): Payer: Self-pay

## 2022-05-05 ENCOUNTER — Other Ambulatory Visit (HOSPITAL_COMMUNITY): Payer: Self-pay

## 2022-06-03 ENCOUNTER — Other Ambulatory Visit (HOSPITAL_COMMUNITY): Payer: Self-pay

## 2022-06-30 ENCOUNTER — Other Ambulatory Visit (HOSPITAL_COMMUNITY): Payer: Self-pay

## 2022-08-04 ENCOUNTER — Other Ambulatory Visit (HOSPITAL_COMMUNITY): Payer: Self-pay

## 2022-09-02 ENCOUNTER — Other Ambulatory Visit (HOSPITAL_COMMUNITY): Payer: Self-pay

## 2022-09-25 ENCOUNTER — Other Ambulatory Visit (HOSPITAL_COMMUNITY): Payer: Self-pay

## 2022-09-25 ENCOUNTER — Other Ambulatory Visit: Payer: Self-pay

## 2022-09-25 MED ORDER — ROSUVASTATIN CALCIUM 10 MG PO TABS
10.0000 mg | ORAL_TABLET | Freq: Every day | ORAL | 1 refills | Status: AC
Start: 2022-09-25 — End: ?
  Filled 2022-09-25: qty 90, 90d supply, fill #0

## 2022-09-25 MED ORDER — LISINOPRIL 10 MG PO TABS
10.0000 mg | ORAL_TABLET | Freq: Every day | ORAL | 1 refills | Status: AC
  Filled 2022-09-25: qty 90, 90d supply, fill #0

## 2022-09-29 ENCOUNTER — Other Ambulatory Visit (HOSPITAL_COMMUNITY): Payer: Self-pay

## 2022-09-29 MED ORDER — ELIQUIS 5 MG PO TABS
5.0000 mg | ORAL_TABLET | Freq: Two times a day (BID) | ORAL | 1 refills | Status: AC
  Filled 2022-09-29: qty 60, 30d supply, fill #0
  Filled 2022-11-04 – 2022-11-05 (×2): qty 60, 30d supply, fill #1
  Filled 2022-12-01: qty 60, 30d supply, fill #2

## 2022-11-04 ENCOUNTER — Other Ambulatory Visit: Payer: Self-pay

## 2022-11-05 ENCOUNTER — Other Ambulatory Visit (HOSPITAL_COMMUNITY): Payer: Self-pay

## 2022-11-05 ENCOUNTER — Other Ambulatory Visit: Payer: Self-pay

## 2022-12-01 ENCOUNTER — Other Ambulatory Visit (HOSPITAL_COMMUNITY): Payer: Self-pay

## 2022-12-19 ENCOUNTER — Other Ambulatory Visit: Payer: Self-pay

## 2022-12-25 ENCOUNTER — Other Ambulatory Visit (HOSPITAL_COMMUNITY): Payer: Self-pay

## 2023-05-26 ENCOUNTER — Ambulatory Visit: Payer: BLUE CROSS/BLUE SHIELD | Admitting: Psychology

## 2023-05-27 ENCOUNTER — Ambulatory Visit: Payer: Medicare Other | Admitting: Psychology

## 2023-05-27 DIAGNOSIS — F4321 Adjustment disorder with depressed mood: Secondary | ICD-10-CM

## 2023-05-27 NOTE — Progress Notes (Signed)
[facility] Behavioral Health Counselor Initial Adult Exam  Name: Anthony Small Date: 05/27/2023 MRN: [id number] DOB: [birth year] PCP: Eartha Inch, MD  Time spent: 60 mins  Guardian/Payee:  Pt   Paperwork requested: No   Reason for Visit /Presenting Problem: Pt presents for session via Caregility video, granting consent for the session.  Pt states he is in his home with no one else present; pt understands the limitations of virtual sessions.  I shared with pt that I am in my office with no one else present here either.    Mental Status Exam: Appearance:   Casual     Behavior:  Appropriate  Motor:  Normal  Speech/Language:   Clear and Coherent  Affect:  Depressed  Mood:  sad  Thought process:  normal  Thought content:    WNL  Sensory/Perceptual disturbances:    WNL  Orientation:  oriented to person, place, and time/date  Attention:  Good  Concentration:  Good  Memory:  WNL  Fund of knowledge:   Good  Insight:    Good  Judgment:   Good  Impulse Control:  Good   Reported Symptoms:  Pt shares that his daughters wanted him to see a counselor because of his depression related to the loss of his wife to breast cancer 4 yrs ago.    Risk Assessment: Danger to Self:  No Self-injurious Behavior: No Danger to Others: No Duty to Warn:no Physical Aggression / Violence:No  Access to Firearms a concern: No  Gang Involvement:No  Patient / guardian was educated about steps to take if suicide or homicide risk level increases between visits: n/a While future psychiatric events cannot be accurately predicted, the patient does not currently require acute inpatient psychiatric care and does not currently meet [facility] involuntary commitment criteria.  Substance Abuse History: Current substance abuse: No ; social use of alcohol; averages 2-3 times per month  Past Psychiatric History:   No previous psychological problems have been observed Outpatient Providers:none History of  Psych Hospitalization: No  Psychological Testing:  none    Abuse History:  Victim of: No.,  none    Report needed: No. Victim of Neglect:No. Perpetrator of  none   Witness / Exposure to Domestic Violence: No   Protective Services Involvement: No  Witness to [employer] Violence:  No   Family History:  Family History  Problem Relation Age of Onset   Hypertension Mother    Cancer Father     Living situation: the patient lives alone; grew up in [place] and went to [employer] and flunked out; he went to [employer] and worked at [employer] and was with [employer]; his mom is still living in an assisted living in [place] near pt's sister and brother-in-law; brother lives in [place], [place]  Sexual Orientation: Straight  Relationship Status: widowed  Name of spouse / other: Noreene Larsson; passed away from breast cancer 4 yrs ago If a parent, number of children / ages: [name] (56 yo); Enrique Sack (65 yo); pt has 6 grand daughters; both daughters live in [place] and visit him often  Support Systems: daughters; wife passed away 4 yrs ago  Surveyor, quantity Stress:  No ; has good retirement funds  Income/Employment/Disability: Neurosurgeon: No   Educational History: Education: Engineer, maintenance (IT); has an Set designer from [employer]: Protestant; grew up in [place]   Any cultural differences that may affect / interfere with treatment:  not applicable   Recreation/Hobbies: watches TV  and walks his dog  Stressors: Other: doesn't like leaving home    Strengths: Family  Barriers:  Pt is isolated socially   Legal History: Pending legal issue / charges: The patient has no significant history of legal issues. 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     apixaban (ELIQUIS) 5 MG TABS tablet Take 1 tablet (5 mg total) by mouth 2 (two) times daily. 180 tablet 1   APIXABAN (ELIQUIS) VTE STARTER PACK (10MG  AND 5MG ) Take as directed on package: start with two-5mg  tablets twice daily for 7 days. On day 8, switch to one-5mg  tablet twice daily. 1 each 0   APIXABAN (ELIQUIS) VTE STARTER PACK (10MG  AND 5MG ) Follow enclosed instructions 74 each 0   lisinopril (ZESTRIL) 10 MG tablet Take 10 mg by mouth daily.     lisinopril (ZESTRIL) 10 MG tablet Take 1 tablet (10 mg total) by mouth daily. 90 tablet 1   methocarbamol (ROBAXIN) 500 MG tablet Take 1 tablet (500 mg total) by mouth every 6 (six) hours as needed for muscle spasms. 60 tablet 0   oxyCODONE (OXY IR/ROXICODONE) 5 MG immediate release tablet Take one tab po q4-6hrs prn pain, may need 1-2 first couple weeks, max 8 tabs in 24hrs 40 tablet 0   rosuvastatin (CRESTOR) 10 MG tablet Take 10 mg by mouth at bedtime.     rosuvastatin (CRESTOR) 10 MG tablet Take 1 tablet (10 mg total) by mouth daily. 90 tablet 1   No current facility-administered medications for this visit.    Allergies  Allergen Reactions   Bee Venom Hives    Diagnoses:  Adjustment disorder with depressed mood  Plan of Care: Encouraged pt to think about what self care activities he might want to engage in and we will meet in 2 wks (06/09/23) for a follow up session in the office.   Karie Kirks, [facility]

## 2023-05-28 ENCOUNTER — Encounter (HOSPITAL_COMMUNITY): Payer: Self-pay | Admitting: Orthopedic Surgery

## 2023-05-28 ENCOUNTER — Other Ambulatory Visit (HOSPITAL_COMMUNITY): Payer: Self-pay | Admitting: Orthopedic Surgery

## 2023-05-28 DIAGNOSIS — R0989 Other specified symptoms and signs involving the circulatory and respiratory systems: Secondary | ICD-10-CM

## 2023-05-29 ENCOUNTER — Ambulatory Visit (HOSPITAL_COMMUNITY)
Admission: RE | Admit: 2023-05-29 | Discharge: 2023-05-29 | Disposition: A | Discharge status: Home / Self Care | Payer: Medicare Other | Source: Ambulatory Visit | Attending: Internal Medicine | Admitting: Internal Medicine

## 2023-05-29 DIAGNOSIS — R0989 Other specified symptoms and signs involving the circulatory and respiratory systems: Secondary | ICD-10-CM | POA: Insufficient documentation

## 2023-06-09 ENCOUNTER — Ambulatory Visit: Payer: Medicare Other | Admitting: Psychology

## 2023-06-09 DIAGNOSIS — F4321 Adjustment disorder with depressed mood: Secondary | ICD-10-CM

## 2023-06-09 NOTE — Progress Notes (Signed)
[facility] Counselor/Therapist Progress Note  Patient ID: Anthony Small, MRN: [id number],    Date: 06/09/2023  Time Spent: 50 minutes        start time: 1500   end time: 1550  Treatment Type: Individual Therapy  Reported Symptoms: Pt presents in person in the office for the session, granting consent for the session.  Mental Status Exam: Appearance:  Casual     Behavior: Appropriate and Rigid  Motor: Normal  Speech/Language:  Clear and Coherent  Affect: Congruent  Mood: normal  Thought process: normal  Thought content:   WNL  Sensory/Perceptual disturbances:   WNL  Orientation: oriented to person, place, and time/date  Attention: Good  Concentration: Good  Memory: WNL  Fund of knowledge:  Good  Insight:   Good  Judgment:  Good  Impulse Control: Good   Risk Assessment: Danger to Self:  No Self-injurious Behavior: No Danger to Others: No Duty to Warn:no Physical Aggression / Violence:No  Access to Firearms a concern: No  Gang Involvement:No   Subjective: Pt shares that he has fallen since our last session.  He believes he was hypotensive since he called his daughters and they came over and took his BP and it was low.  He has never had that issue before.  Pt's daughter, [name], brought him to the session today.  Pt shares that he he used to take Chantix to help him stop smoking; he has started taking it again and has not smoked in 5-6 days.  Pt shares that he still misses his wife who passed away about 4 years ago from cancer; started as breast cancer and then went to her bones and her liver.  He has 6 grand daughters, between the ages of 69 yo and 35 yo.  Pt shares he is trying to drink more water since his hypotensive issue.  Pt stays connected to his wife's family and his own family as well.  Pt feels comfortable that he has completed the grieving process for her but he still sometimes gets sad about her not being here; they were married for 38 yrs.  Pt  shares that he is planning to go see his mom who is in an assisted living facility in [place], [place], near his sister. He is going in mid December with his daughters and their kids.  Pt's self care activities have been impacted by his back injury; he continues to visit with his daughters and talks with his family members regularly as well.  Pt is planning to have Thanksgiving with his family.  Encouraged pt to continue with his self care activities and we will meet in 2 wks for a follow up session.  Interventions: Cognitive Behavioral Therapy  Diagnosis:Adjustment disorder with depressed mood  Plan: Treatment Plan Strengths/Abilities:  Intelligent, Intuitive, Willing to participate in therapy Treatment Preferences:  Outpatient Individual Therapy Statement of Needs:  Patient is to use CBT, mindfulness and coping skills to help manage and/or decrease symptoms associated with their diagnosis. Symptoms:  Depressed/Irritable mood, worry, social withdrawal Problems Addressed:  Depressive thoughts, Sadness, Sleep issues, etc. Long Term Goals:  Pt to reduce overall level, frequency, and intensity of the feelings of depression as evidenced by decreased irritability, negative self talk, and helpless feelings from 6 to 7 days/week to 0 to 1 days/week, per client report, for at least 3 consecutive months.  Progress: 30% Short Term Goals:  Pt to verbally express understanding of the relationship between feelings of depression and their impact on  thinking patterns and behaviors.  Pt to verbalize an understanding of the role that distorted thinking plays in creating fears, excessive worry, and ruminations.  Progress: 30% Target Date:  06/08/2024 Frequency:  Bi-weekly Modality:  Cognitive Behavioral Therapy Interventions by Therapist:  Therapist will use CBT, Mindfulness exercises, Coping skills and Referrals, as needed by client. Client has verbally approved this treatment plan.  Karie Kirks, [facility]

## 2023-10-27 ENCOUNTER — Other Ambulatory Visit (HOSPITAL_COMMUNITY): Payer: Self-pay

## 2023-10-27 MED ORDER — LISINOPRIL 10 MG PO TABS
10.0000 mg | ORAL_TABLET | Freq: Every day | ORAL | 0 refills | Status: AC
  Filled 2023-10-27: qty 90, 90d supply, fill #0

## 2023-10-27 MED ORDER — ROSUVASTATIN CALCIUM 10 MG PO TABS
10.0000 mg | ORAL_TABLET | Freq: Every day | ORAL | 0 refills | Status: AC
  Filled 2023-10-27: qty 90, 90d supply, fill #0
